# Patient Record
Sex: Male | Born: 1985 | Race: White | Hispanic: No | Marital: Married | Smoking: Current some day smoker
Health system: Southern US, Community
[De-identification: ages and names within clinical notes are randomized; demographics above are authoritative.]

## PROBLEM LIST (undated history)

## (undated) HISTORY — PX: KNEE SURGERY: SHX244

## (undated) HISTORY — PX: OTHER SURGICAL HISTORY: SHX169

---
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
## 2007-01-01 ENCOUNTER — Encounter: Payer: Self-pay | Admitting: Orthopaedic Surgery

## 2007-06-02 ENCOUNTER — Ambulatory Visit: Payer: Self-pay

## 2010-07-07 ENCOUNTER — Ambulatory Visit: Payer: Self-pay | Admitting: Internal Medicine

## 2011-04-05 ENCOUNTER — Ambulatory Visit: Payer: Self-pay

## 2011-04-19 ENCOUNTER — Ambulatory Visit: Payer: Self-pay

## 2011-10-10 ENCOUNTER — Ambulatory Visit: Payer: Self-pay | Admitting: Internal Medicine

## 2011-10-10 LAB — RAPID INFLUENZA A&B ANTIGENS

## 2011-10-10 LAB — RAPID STREP-A WITH REFLX: Micro Text Report: POSITIVE

## 2012-04-01 ENCOUNTER — Ambulatory Visit: Payer: Self-pay | Admitting: Internal Medicine

## 2012-06-21 ENCOUNTER — Ambulatory Visit: Payer: Self-pay | Admitting: Family Medicine

## 2012-06-21 LAB — DOT URINE DIP
Blood: NEGATIVE
Protein: NEGATIVE
Specific Gravity: 1.005 (ref 1.003–1.030)

## 2013-04-16 ENCOUNTER — Encounter: Payer: Self-pay | Admitting: Orthopedic Surgery

## 2013-05-02 ENCOUNTER — Encounter: Payer: Self-pay | Admitting: Orthopedic Surgery

## 2013-08-12 ENCOUNTER — Ambulatory Visit: Payer: Self-pay | Admitting: Internal Medicine

## 2013-08-13 ENCOUNTER — Ambulatory Visit: Payer: Self-pay | Admitting: Physician Assistant

## 2015-08-15 ENCOUNTER — Ambulatory Visit
Admission: EM | Admit: 2015-08-15 | Discharge: 2015-08-15 | Disposition: A | Discharge status: Home / Self Care | Payer: Self-pay | Attending: Family Medicine | Admitting: Family Medicine

## 2015-08-15 ENCOUNTER — Encounter: Payer: Self-pay | Admitting: Emergency Medicine

## 2015-08-15 DIAGNOSIS — Z0289 Encounter for other administrative examinations: Secondary | ICD-10-CM

## 2015-08-15 LAB — DEPT OF TRANSP DIPSTICK, URINE (ARMC ONLY)
Glucose, UA: NEGATIVE mg/dL
HGB URINE DIPSTICK: NEGATIVE
Protein, ur: NEGATIVE mg/dL
SPECIFIC GRAVITY, URINE: 1.015 (ref 1.005–1.030)

## 2015-08-15 NOTE — ED Notes (Signed)
Patient here for DOT Physical.  

## 2015-08-15 NOTE — ED Provider Notes (Signed)
CSN: [id number]     Arrival date & time 08/15/15  [id number] History   First MD Initiated Contact with Patient 08/15/15 0845     Chief Complaint  Patient presents with  . DOT Physical    (Consider location/radiation/quality/duration/timing/severity/associated sxs/prior Treatment) HPI Comments: Patient here for DOT Physical (see scanned form)   The history is provided by the patient.    History reviewed. No pertinent past medical history. Past Surgical History  Procedure Laterality Date  . Knee surgery Right   . Left leg surgery     History reviewed. No pertinent family history. Social History  Substance Use Topics  . Smoking status: Never Smoker   . Smokeless tobacco: Never Used  . Alcohol Use: Yes    Review of Systems  Allergies  Review of patient's allergies indicates no known allergies.  Home Medications   Prior to Admission medications   Not on File   Meds Ordered and Administered this Visit  Medications - No data to display  BP 129/80 mmHg  Pulse 73  Temp(Src) 97 F (36.1 C) (Tympanic)  Resp 16  Ht 5\' 9"  (1.753 m)  Wt 179 lb (81.194 kg)  BMI 26.42 kg/m2  SpO2 100% No data found.   Physical Exam  ED Course  Procedures (including critical care time)  Labs Review Labs Reviewed  DEPT OF TRANSP DIPSTICK, URINE([facility] ONLY)    Imaging Review No results found.   Visual Acuity Review  Right Eye Distance: 20/20 uncorrected Left Eye Distance: 20/13 uncorrected Bilateral Distance: 20/13 uncorrected  Right Eye Near:   Left Eye Near:    Bilateral Near:         MDM   1. Encounter for examination required by Department of Transportation (DOT)    DOT Physical (medically qualified for 2 year; see scanned form)    Payton Mccallumrlando Seriyah Collison, MD 08/15/15 [phone]

## 2016-04-10 ENCOUNTER — Encounter: Payer: Self-pay | Admitting: Emergency Medicine

## 2016-04-10 ENCOUNTER — Ambulatory Visit
Admission: EM | Admit: 2016-04-10 | Discharge: 2016-04-10 | Disposition: A | Discharge status: Home / Self Care | Payer: Self-pay | Attending: Student | Admitting: Student

## 2016-04-10 DIAGNOSIS — L237 Allergic contact dermatitis due to plants, except food: Secondary | ICD-10-CM

## 2016-04-10 MED ORDER — METHYLPREDNISOLONE ACETATE 80 MG/ML IJ SUSP: 120.0000 mg | Freq: Once | INTRAMUSCULAR | Status: DC

## 2016-04-10 MED ORDER — CEPHALEXIN 500 MG PO CAPS: 500.0000 mg | ORAL_CAPSULE | Freq: Four times a day (QID) | ORAL | 0 refills | Status: DC

## 2016-04-10 MED ORDER — METHYLPREDNISOLONE SODIUM SUCC 125 MG IJ SOLR
125.0000 mg | Freq: Once | INTRAMUSCULAR | Status: AC
  Administered 2016-04-10: 125 mg via INTRAMUSCULAR

## 2016-04-10 MED ORDER — HYDROCORTISONE 2.5 % EX LOTN: TOPICAL_LOTION | Freq: Two times a day (BID) | CUTANEOUS | 0 refills | Status: DC

## 2016-04-10 NOTE — ED Notes (Signed)
Patient waited 15 minutes and no reactions were noted. Injection site is unremarkable. 

## 2016-04-10 NOTE — Discharge Instructions (Signed)
-  Apply lotion as instructed. -If redness develops or drainage continued, fill antibiotic.

## 2016-04-10 NOTE — ED Provider Notes (Signed)
CSN: [id number]     Arrival date & time 04/10/16  0913 History   First MD Initiated Contact with Patient 04/10/16 [phone]     Chief Complaint  Patient presents with  . Rash   (Consider location/radiation/quality/duration/timing/severity/associated sxs/prior Treatment)  HPI History reviewed. No pertinent past medical history.   The patient presents today with a itchy rash on several locations of his body starting on Monday. The patient believes that he is working as yard when he came in contact with poison ivy. Since then his notice a rash on the dorsal aspect of the right arm, his chest, back and legs. He noticed that the rash on the right arm is weeping however he has not noticed any redness. Patient presents today for further management.  Past Surgical History:  Procedure Laterality Date  . KNEE SURGERY Right   . left leg surgery      History reviewed. No pertinent family history. Social History  Substance Use Topics  . Smoking status: Never Smoker  . Smokeless tobacco: Never Used  . Alcohol use Yes    Review of Systems  Constitutional: Negative.   HENT: Negative.   Eyes: Negative.   Respiratory: Negative.   Cardiovascular: Negative.   Gastrointestinal: Negative.   Endocrine: Negative.   Musculoskeletal: Negative.   Skin: Positive for rash.  Allergic/Immunologic: Negative.   Neurological: Negative.   Hematological: Negative.   Psychiatric/Behavioral: Negative.     Allergies  Review of patient's allergies indicates no known allergies.  Home Medications   Prior to Admission medications   Medication Sig Start Date End Date Taking? Authorizing Provider  cephALEXin (KEFLEX) 500 MG capsule Take 1 capsule (500 mg total) by mouth 4 (four) times daily. 04/10/16   Anson OregonJames Lance McGhee, PA-C  hydrocortisone 2.5 % lotion Apply topically 2 (two) times daily. 04/10/16   Anson OregonJames Lance McGhee, PA-C   Meds Ordered and Administered this Visit   Medications  methylPREDNISolone sodium  succinate (SOLU-MEDROL) 125 mg/2 mL injection 125 mg (not administered)    BP 128/88 (BP Location: Left Arm)   Pulse 70   Temp 98 F (36.7 C) (Oral)   Resp 16   Ht 5\' 8"  (1.727 m)   Wt 170 lb (77.1 kg)   SpO2 100%   BMI 25.85 kg/m  No data found.  Physical Exam  Constitutional: He appears well-developed and well-nourished. No distress.  Skin: Rash noted. Rash is vesicular. He is not diaphoretic.  Rash resembling poison ivy present to the patient's right arm, chest, back and both quad areas of his legs.  Rash on the right arm is weeping serosanguinous fluid, no purulent discharge or signs of infection.    Urgent Care Course   Clinical Course    Procedures (including critical care time)  Labs Review Labs Reviewed - No data to display  Imaging Review No results found.  MDM   1. Poison ivy dermatitis   -Pt was offered and received a Solu-Medrol injection at today's visit. -He was prescribed hydrocortisone cream to apply to the skin on a daily basis.  He was also prescribed a antibiotic incase he notices any redness or purulent discharge from the arm.  He is to fill this only if he notices these changes. -He will follow-up on a as needed basis.    Anson OregonJames Lance McGhee, PA-C 04/10/16 1006

## 2016-04-10 NOTE — ED Triage Notes (Signed)
Patient itchy rash on his arms that started Monday.

## 2016-06-24 ENCOUNTER — Emergency Department
Admission: EM | Admit: 2016-06-24 | Discharge: 2016-06-24 | Disposition: A | Discharge status: Home / Self Care | Payer: Self-pay | Attending: Emergency Medicine | Admitting: Emergency Medicine

## 2016-06-24 ENCOUNTER — Encounter: Payer: Self-pay | Admitting: *Deleted

## 2016-06-24 ENCOUNTER — Emergency Department: Payer: Self-pay

## 2016-06-24 DIAGNOSIS — Z23 Encounter for immunization: Secondary | ICD-10-CM | POA: Insufficient documentation

## 2016-06-24 DIAGNOSIS — Y939 Activity, unspecified: Secondary | ICD-10-CM | POA: Insufficient documentation

## 2016-06-24 DIAGNOSIS — S61011A Laceration without foreign body of right thumb without damage to nail, initial encounter: Secondary | ICD-10-CM | POA: Insufficient documentation

## 2016-06-24 DIAGNOSIS — S61411A Laceration without foreign body of right hand, initial encounter: Secondary | ICD-10-CM

## 2016-06-24 DIAGNOSIS — Y929 Unspecified place or not applicable: Secondary | ICD-10-CM | POA: Insufficient documentation

## 2016-06-24 DIAGNOSIS — Y999 Unspecified external cause status: Secondary | ICD-10-CM | POA: Insufficient documentation

## 2016-06-24 DIAGNOSIS — S60221A Contusion of right hand, initial encounter: Secondary | ICD-10-CM

## 2016-06-24 DIAGNOSIS — W228XXA Striking against or struck by other objects, initial encounter: Secondary | ICD-10-CM | POA: Insufficient documentation

## 2016-06-24 MED ORDER — LIDOCAINE HCL (PF) 1 % IJ SOLN
5.0000 mL | Freq: Once | INTRAMUSCULAR | Status: AC
  Administered 2016-06-24: 5 mL
  Filled 2016-06-24: qty 10

## 2016-06-24 MED ORDER — TETANUS-DIPHTH-ACELL PERTUSSIS 5-2.5-18.5 LF-MCG/0.5 IM SUSP
0.5000 mL | Freq: Once | INTRAMUSCULAR | Status: AC
  Administered 2016-06-24: 0.5 mL via INTRAMUSCULAR
  Filled 2016-06-24: qty 0.5

## 2016-06-24 MED ORDER — CLINDAMYCIN HCL 300 MG PO CAPS: 300.0000 mg | ORAL_CAPSULE | Freq: Three times a day (TID) | ORAL | 0 refills | Status: DC

## 2016-06-24 MED ORDER — CEPHALEXIN 500 MG PO CAPS: 500.0000 mg | ORAL_CAPSULE | Freq: Three times a day (TID) | ORAL | 0 refills | Status: DC

## 2016-06-24 MED ORDER — CEPHALEXIN 500 MG PO CAPS
500.0000 mg | ORAL_CAPSULE | Freq: Once | ORAL | Status: AC
  Administered 2016-06-24: 500 mg via ORAL
  Filled 2016-06-24: qty 1

## 2016-06-24 MED ORDER — LIDOCAINE-EPINEPHRINE (PF) 1 %-1:200000 IJ SOLN: 10.0000 mL | Freq: Once | INTRAMUSCULAR | Status: DC

## 2016-06-24 MED ORDER — CLINDAMYCIN HCL 150 MG PO CAPS
300.0000 mg | ORAL_CAPSULE | Freq: Once | ORAL | Status: AC
  Administered 2016-06-24: 300 mg via ORAL
  Filled 2016-06-24: qty 2

## 2016-06-24 MED ORDER — IBUPROFEN 800 MG PO TABS
800.0000 mg | ORAL_TABLET | Freq: Once | ORAL | Status: AC
  Administered 2016-06-24: 800 mg via ORAL
  Filled 2016-06-24: qty 1

## 2016-06-24 MED ORDER — IBUPROFEN 800 MG PO TABS: 800.0000 mg | ORAL_TABLET | Freq: Three times a day (TID) | ORAL | 0 refills | Status: DC | PRN

## 2016-06-24 NOTE — ED Triage Notes (Signed)
Patient states he punched a window with his right hand and has laceration on right thumb.

## 2016-06-24 NOTE — ED Provider Notes (Signed)
[facility] Emergency Department Provider Note  ____________________________________________  Time seen: Approximately 3:13 PM  I have reviewed the triage vital signs and the nursing notes.   HISTORY  Chief Complaint Laceration    HPI Ronald Vega is a 30 y.o. male , NAD, presents to the emergency department for evaluation of right hand pain and lacerations. Patient states he dropped the bowl of green beans than became angry and punched through glass with his right hand.Has pain about the right thumb and palmar surface of the hand at the base of the thumb. Has full range of motion of all digits of the right hand as well as the wrist and hand since the incident. It is uncertain if any foreign bodies are present in the hand due to the glass breaking. Denies numbness, weakness, tingling. Has had no redness, swelling or bruising. Denies fevers or chills. Is uncertain when his last tetanus vaccination was.   History reviewed. No pertinent past medical history.  There are no active problems to display for this patient.   Past Surgical History:  Procedure Laterality Date  . KNEE SURGERY Right   . left leg surgery      Prior to Admission medications   Medication Sig Start Date End Date Taking? Authorizing Provider  cephALEXin (KEFLEX) 500 MG capsule Take 1 capsule (500 mg total) by mouth 3 (three) times daily. 06/24/16   Endrit Gittins L Varnell Donate, PA-C  clindamycin (CLEOCIN) 300 MG capsule Take 1 capsule (300 mg total) by mouth 3 (three) times daily. 06/24/16   Pamla Pangle L Mikela Senn, PA-C  hydrocortisone 2.5 % lotion Apply topically 2 (two) times daily. 04/10/16   Anson OregonJames Lance McGhee, PA-C  ibuprofen (ADVIL,MOTRIN) 800 MG tablet Take 1 tablet (800 mg total) by mouth every 8 (eight) hours as needed (pain). 06/24/16   Dwana Garin L Kierria Feigenbaum, PA-C    Allergies Patient has no known allergies.  History reviewed. No pertinent family history.  Social History Social History  Substance Use  Topics  . Smoking status: Never Smoker  . Smokeless tobacco: Never Used  . Alcohol use Yes     Comment: occasionally     Review of Systems  Constitutional: No fever/chills Musculoskeletal: Positive right hand pain at site of lacerations. Negative for wrist pain.  Skin: Positive lacerations right thumb. Negative for rash, redness, swelling, bruising. Neurological: Negative for Numbness, weakness, tingling  ____________________________________________   PHYSICAL EXAM:  VITAL SIGNS: ED Triage Vitals  Enc Vitals Group     BP 06/24/16 1509 140/71     Pulse Rate 06/24/16 1509 72     Resp 06/24/16 1509 18     Temp --      Temp src --      SpO2 06/24/16 1509 97 %     Weight 06/24/16 1502 175 lb (79.4 kg)     Height 06/24/16 1502 5\' 9"  (1.753 m)     Head Circumference --      Peak Flow --      Pain Score 06/24/16 1502 1     Pain Loc --      Pain Edu? --      Excl. in GC? --      Constitutional: Alert and oriented. Well appearing and in no acute distress. Eyes: Conjunctivae are normal. Head: Atraumatic. Cardiovascular: Good peripheral circulation with 2+ pulses noted in the right upper extremity. Capillary refill is brisk in all digits of the right hand. Respiratory: Normal respiratory effort without tachypnea or retractions.  Musculoskeletal:  Full range of motion of the right wrist, hand and fingers without difficulty. Strength of all fingers on the right hand is 5/5. No lower extremity tenderness nor edema.  No joint effusions. Neurologic:  Normal speech and language. No gross focal neurologic deficits are appreciated.  Skin:  1.5cm laceration to right dorsal thumb. Multiple small, superficial lacerations noted around the right thumb and hand with bleeding controlled. All wounds were thoroughly irrigated and no foreign bodies palpated nor visualized. Skin is warm, dry and intact. No rash noted. Psychiatric: Mood and affect are normal. Speech and behavior are normal. Patient  exhibits appropriate insight and judgement.   ____________________________________________   LABS  None ____________________________________________  EKG  None ____________________________________________  RADIOLOGY I, Hope PigeonJami L Melbourne Jakubiak, personally viewed and evaluated these images (plain radiographs) as part of my medical decision making, as well as reviewing the written report by the radiologist.  Dg Hand Complete Right  Result Date: 06/24/2016 CLINICAL DATA:  Hand laceration from window.  Initial encounter. EXAM: RIGHT HAND - COMPLETE 3+ VIEW COMPARISON:  None. FINDINGS: Faint densities overlapping the thumb on the AP views are not seen laterally and not convincing for embedded foreign body. No acute fracture or malalignment. IMPRESSION: Negative for fracture or definite foreign body. Electronically Signed   By: Marnee SpringJonathon  Watts M.D.   On: 06/24/2016 15:31    ____________________________________________    PROCEDURES  Procedure(s) performed: None   .Marland Kitchen.Laceration Repair Date/Time: 06/24/2016 4:28 PM Performed by: Hope PigeonHAGLER, Alzena Gerber L Authorized by: Hope PigeonHAGLER, Cayde Held L   Consent:    Consent obtained:  Verbal   Consent given by:  Patient   Risks discussed:  Infection, pain and poor cosmetic result   Alternatives discussed:  No treatment Anesthesia (see MAR for exact dosages):    Anesthesia method:  Local infiltration   Local anesthetic:  Lidocaine 1% w/o epi Laceration details:    Location:  Finger   Finger location:  R thumb   Length (cm):  1.5   Depth (mm):  4 Repair type:    Repair type:  Simple Pre-procedure details:    Preparation:  Patient was prepped and draped in usual sterile fashion and imaging obtained to evaluate for foreign bodies Exploration:    Hemostasis achieved with:  Direct pressure and tourniquet   Wound exploration: wound explored through full range of motion and entire depth of wound probed and visualized     Wound extent: areolar tissue violated and  fascia violated     Wound extent: no foreign bodies/material noted, no muscle damage noted, no nerve damage noted, no tendon damage noted, no underlying fracture noted and no vascular damage noted     Contaminated: no   Treatment:    Area cleansed with:  Betadine   Amount of cleaning:  Standard   Irrigation solution:  Sterile saline   Irrigation method:  Pressure wash   Foreign body removal: No foreign bodies.   Skin repair:    Repair method:  Sutures   Suture size:  4-0   Suture material:  Nylon   Suture technique:  Simple interrupted   Number of sutures:  4 Approximation:    Approximation:  Close   Vermilion border: well-aligned   Post-procedure details:    Dressing:  Non-adherent dressing and splint for protection   Patient tolerance of procedure:  Tolerated well, no immediate complications     Medications  Tdap (BOOSTRIX) injection 0.5 mL (0.5 mLs Intramuscular Given 06/24/16 1517)  lidocaine (PF) (XYLOCAINE) 1 % injection 5  mL (5 mLs Infiltration Given 06/24/16 1632)  cephALEXin (KEFLEX) capsule 500 mg (500 mg Oral Given 06/24/16 1632)  clindamycin (CLEOCIN) capsule 300 mg (300 mg Oral Given 06/24/16 1632)  ibuprofen (ADVIL,MOTRIN) tablet 800 mg (800 mg Oral Given 06/24/16 1632)     ____________________________________________   INITIAL IMPRESSION / ASSESSMENT AND PLAN / ED COURSE  Pertinent labs & imaging results that were available during my care of the patient were reviewed by me and considered in my medical decision making (see chart for details).  Clinical Course as of Jun 24 1640  Thu Jun 24, 2016  1542 No lidocaine with epi is available due to national shortage.   [JH]    Clinical Course User Index [JH] Carly Sabo L Gilmer Kaminsky, PA-C    Patient's diagnosis is consistent with Laceration right thumb without foreign body without damage to nail, laceration right around without foreign body, contusion of right hand and need for tetanus booster. Patient's right thumb was  placed in a splint for comfort care. Patient tolerated tetanus booster in the emergency without immediate adverse event. Patient was given first doses of antibiotics as well as ibuprofen prior to discharge and tolerated well without side effects. Patient will be discharged home with prescriptions for Keflex and clindamycin to take as directed. Patient is to follow up with Dr. Hyacinth Meeker in orthopedics if any symptoms of infection are noted. Patient is to follow-up with [facility] in one week for suture removal. Patient is given ED precautions to return to the ED for any worsening or new symptoms.    ____________________________________________  FINAL CLINICAL IMPRESSION(S) / ED DIAGNOSES  Final diagnoses:  Laceration of right thumb without foreign body without damage to nail, initial encounter  Laceration of right hand without foreign body, initial encounter  Need for tetanus booster  Contusion of right hand, initial encounter      NEW MEDICATIONS STARTED DURING THIS VISIT:  New Prescriptions   CEPHALEXIN (KEFLEX) 500 MG CAPSULE    Take 1 capsule (500 mg total) by mouth 3 (three) times daily.   CLINDAMYCIN (CLEOCIN) 300 MG CAPSULE    Take 1 capsule (300 mg total) by mouth 3 (three) times daily.   IBUPROFEN (ADVIL,MOTRIN) 800 MG TABLET    Take 1 tablet (800 mg total) by mouth every 8 (eight) hours as needed (pain).         Hope Pigeon, PA-C 06/24/16 1643    Jeanmarie Plant, MD 06/24/16 [phone]

## 2017-03-30 ENCOUNTER — Ambulatory Visit
Admission: EM | Admit: 2017-03-30 | Discharge: 2017-03-30 | Disposition: A | Discharge status: Home / Self Care | Payer: Self-pay | Attending: Family Medicine | Admitting: Family Medicine

## 2017-03-30 DIAGNOSIS — L237 Allergic contact dermatitis due to plants, except food: Secondary | ICD-10-CM

## 2017-03-30 MED ORDER — METHYLPREDNISOLONE SODIUM SUCC 125 MG IJ SOLR
125.0000 mg | Freq: Once | INTRAMUSCULAR | Status: AC
  Administered 2017-03-30: 125 mg via INTRAMUSCULAR

## 2017-03-30 MED ORDER — PREDNISONE 10 MG (21) PO TBPK: ORAL_TABLET | ORAL | 0 refills | Status: DC

## 2017-03-30 NOTE — ED Provider Notes (Signed)
[facility]    CSN: [id number] Arrival date & time: 03/30/17  0800     History   Chief Complaint Chief Complaint  Patient presents with  . Poison Ivy    HPI Ronald Vega is a 31 y.o. male.   Patient reports being out in the yard on Saturday and Sunday he started itching Monday rash really start working out and then Wednesday is over most of his body face arms and legs. He states is very sensitive to poison ivy and poison oak. No known drug allergies he does not smoke. Surgeries as a couple orthopedic surgeries no chronic medical problems and no pertinent family medical history relevant to today's visit.   The history is provided by the patient. No language interpreter was used.  Poison Lajoyce Corners  This is a new problem. The current episode started more than 2 days ago. The problem has been rapidly worsening. Pertinent negatives include no chest pain, no abdominal pain, no headaches and no shortness of breath. Nothing aggravates the symptoms. Nothing relieves the symptoms. The treatment provided no relief.    History reviewed. No pertinent past medical history.  There are no active problems to display for this patient.   Past Surgical History:  Procedure Laterality Date  . KNEE SURGERY Right   . left leg surgery         Home Medications    Prior to Admission medications   Medication Sig Start Date End Date Taking? Authorizing Provider  cephALEXin (KEFLEX) 500 MG capsule Take 1 capsule (500 mg total) by mouth 3 (three) times daily. 06/24/16   Hagler, Jami L, PA-C  clindamycin (CLEOCIN) 300 MG capsule Take 1 capsule (300 mg total) by mouth 3 (three) times daily. 06/24/16   Hagler, Jami L, PA-C  hydrocortisone 2.5 % lotion Apply topically 2 (two) times daily. 04/10/16   Anson Oregon, PA-C  ibuprofen (ADVIL,MOTRIN) 800 MG tablet Take 1 tablet (800 mg total) by mouth every 8 (eight) hours as needed (pain). 06/24/16   Hagler, Jami L, PA-C  predniSONE  (STERAPRED UNI-PAK 21 TAB) 10 MG (21) TBPK tablet 6 tabs day 1 and 2, 5 tabs day 3 and 4, 4 tabs day 5 and 6, 3 tabs day 7 and 8, 2 tabs day 9 and 10, 1 tab day 11 and 12. Take orally 03/30/17   Hassan Rowan, MD    Family History History reviewed. No pertinent family history.  Social History Social History  Substance Use Topics  . Smoking status: Never Smoker  . Smokeless tobacco: Never Used  . Alcohol use Yes     Comment: occasionally     Allergies   Patient has no known allergies.   Review of Systems Review of Systems  Respiratory: Negative for shortness of breath.   Cardiovascular: Negative for chest pain.  Gastrointestinal: Negative for abdominal pain.  Skin: Positive for rash.  Neurological: Negative for headaches.  All other systems reviewed and are negative.    Physical Exam Triage Vital Signs ED Triage Vitals  Enc Vitals Group     BP 03/30/17 0817 137/78     Pulse Rate 03/30/17 0817 69     Resp 03/30/17 0817 16     Temp 03/30/17 0817 97.9 F (36.6 C)     Temp Source 03/30/17 0817 Oral     SpO2 03/30/17 0817 100 %     Weight 03/30/17 0818 175 lb (79.4 kg)     Height 03/30/17 0818 5\' 9"  (1.753 m)  Head Circumference --      Peak Flow --      Pain Score --      Pain Loc --      Pain Edu? --      Excl. in GC? --    No data found.   Updated Vital Signs BP 137/78 (BP Location: Right Arm)   Pulse 69   Temp 97.9 F (36.6 C) (Oral)   Resp 16   Ht 5\' 9"  (1.753 m)   Wt 175 lb (79.4 kg)   SpO2 100%   BMI 25.84 kg/m   Visual Acuity Right Eye Distance:   Left Eye Distance:   Bilateral Distance:    Right Eye Near:   Left Eye Near:    Bilateral Near:     Physical Exam  Constitutional: He is oriented to person, place, and time. He appears well-developed and well-nourished. No distress.  HENT:  Head: Normocephalic and atraumatic.  Right Ear: External ear normal.  Left Ear: External ear normal.  Mouth/Throat: Oropharynx is clear and moist.    Eyes: Pupils are equal, round, and reactive to light.  Neck: Normal range of motion. Neck supple.  Pulmonary/Chest: Effort normal.  Musculoskeletal: Normal range of motion. He exhibits no edema.  Neurological: He is alert and oriented to person, place, and time.  Skin: Rash noted. He is not diaphoretic. There is erythema.  Psychiatric: He has a normal mood and affect.  Vitals reviewed.    UC Treatments / Results  Labs (all labs ordered are listed, but only abnormal results are displayed) Labs Reviewed - No data to display  EKG  EKG Interpretation None       Radiology No results found.  Procedures Procedures (including critical care time)  Medications Ordered in UC Medications  methylPREDNISolone sodium succinate (SOLU-MEDROL) 125 mg/2 mL injection 125 mg (not administered)     Initial Impression / Assessment and Plan / UC Course  I have reviewed the triage vital signs and the nursing notes.  Pertinent labs & imaging results that were available during my care of the patient were reviewed by me and considered in my medical decision making (see chart for details).   patient was given an injection of Solu-Medrol 125 IM follow-up PCP in 2 weeks needed declined work note will also add 12 days of prednisone as well to his treatment plan  Final Clinical Impressions(s) / UC Diagnoses   Final diagnoses:  Allergic contact dermatitis due to plants, except food    New Prescriptions New Prescriptions   PREDNISONE (STERAPRED UNI-PAK 21 TAB) 10 MG (21) TBPK TABLET    6 tabs day 1 and 2, 5 tabs day 3 and 4, 4 tabs day 5 and 6, 3 tabs day 7 and 8, 2 tabs day 9 and 10, 1 tab day 11 and 12. Take orally   Note: This dictation was prepared with Dragon dictation along with smaller phrase technology. Any transcriptional errors that result from this process are unintentional.  Controlled Substance Prescriptions [place] Controlled Substance Registry consulted? Not Applicable   Hassan Rowan, MD 03/30/17 [phone]

## 2017-03-30 NOTE — ED Triage Notes (Signed)
Reports poison ivy "all over" and "I get it every year".

## 2018-08-11 IMAGING — DX DG HAND COMPLETE 3+V*R*
3 series · 3 of 3 positions shown · non-contrast
Comparison: None.

CLINICAL DATA: Hand laceration from window.  Initial encounter.

EXAM:
RIGHT HAND - COMPLETE 3+ VIEW

[hand ap]
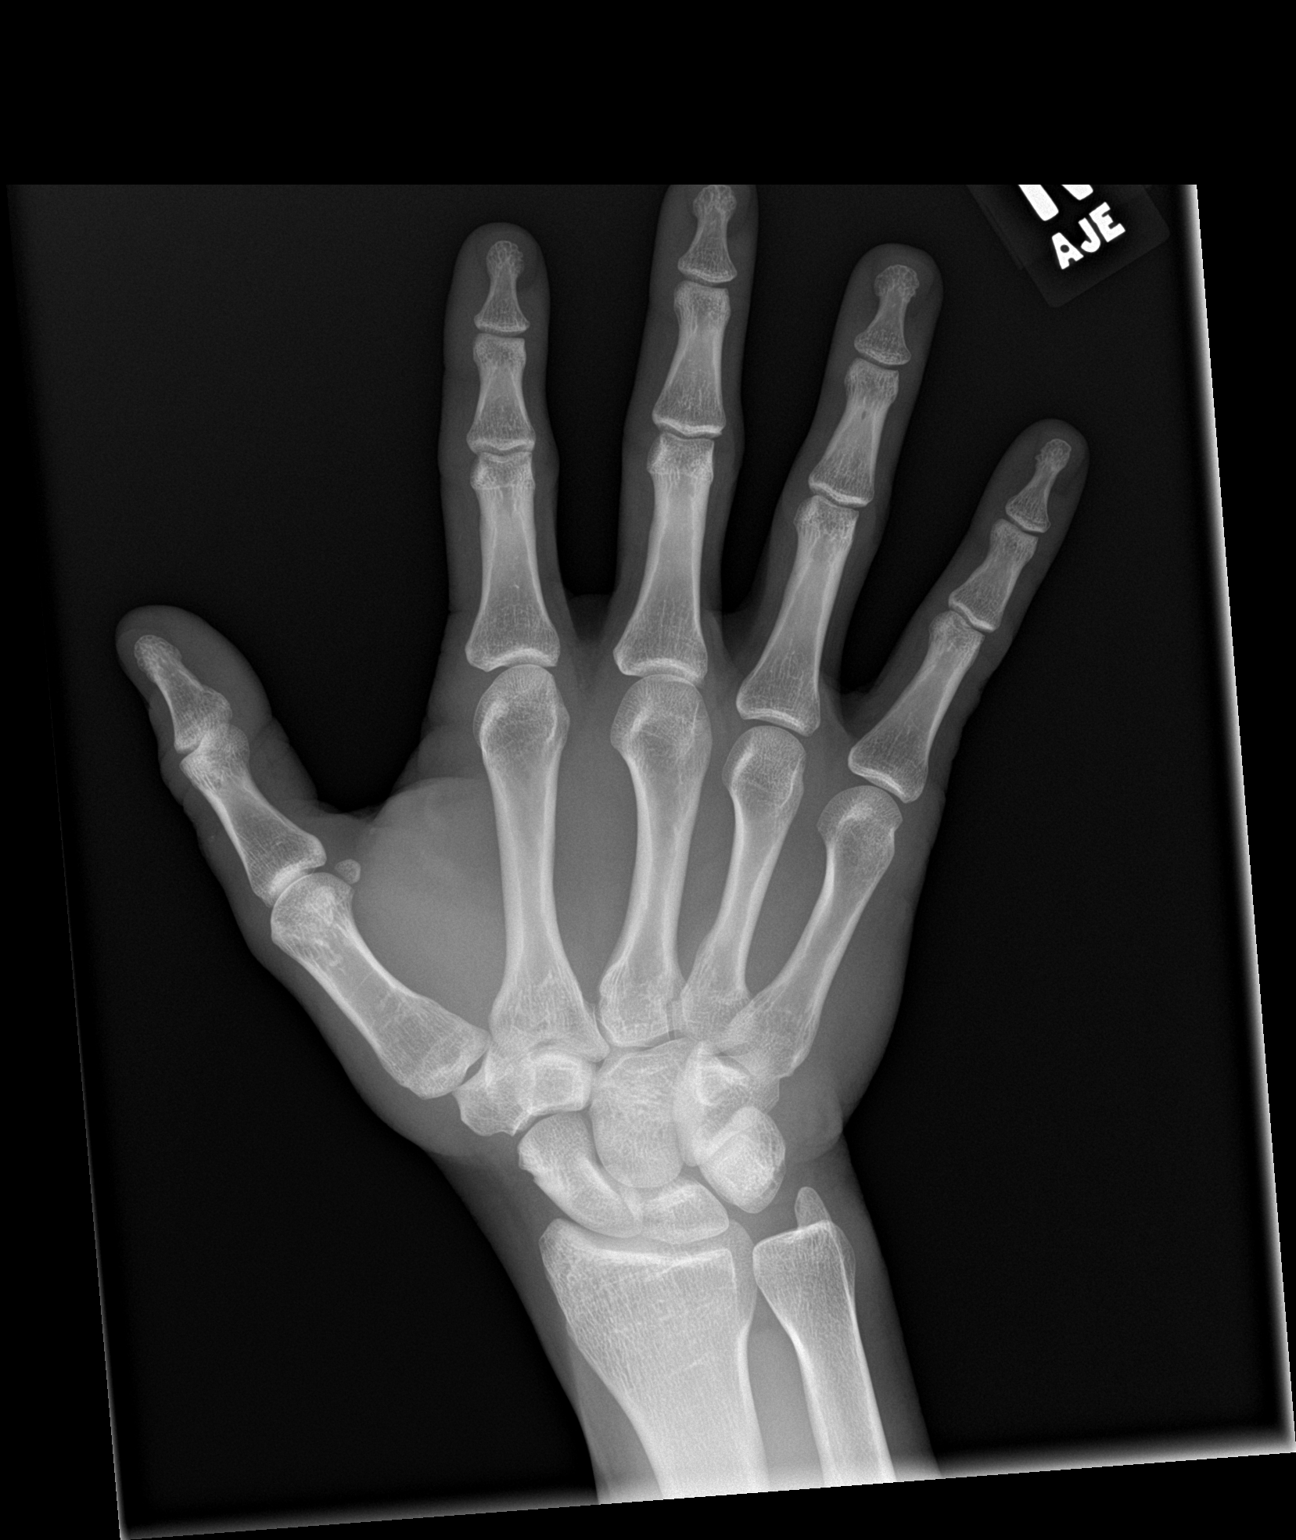

[hand obl]
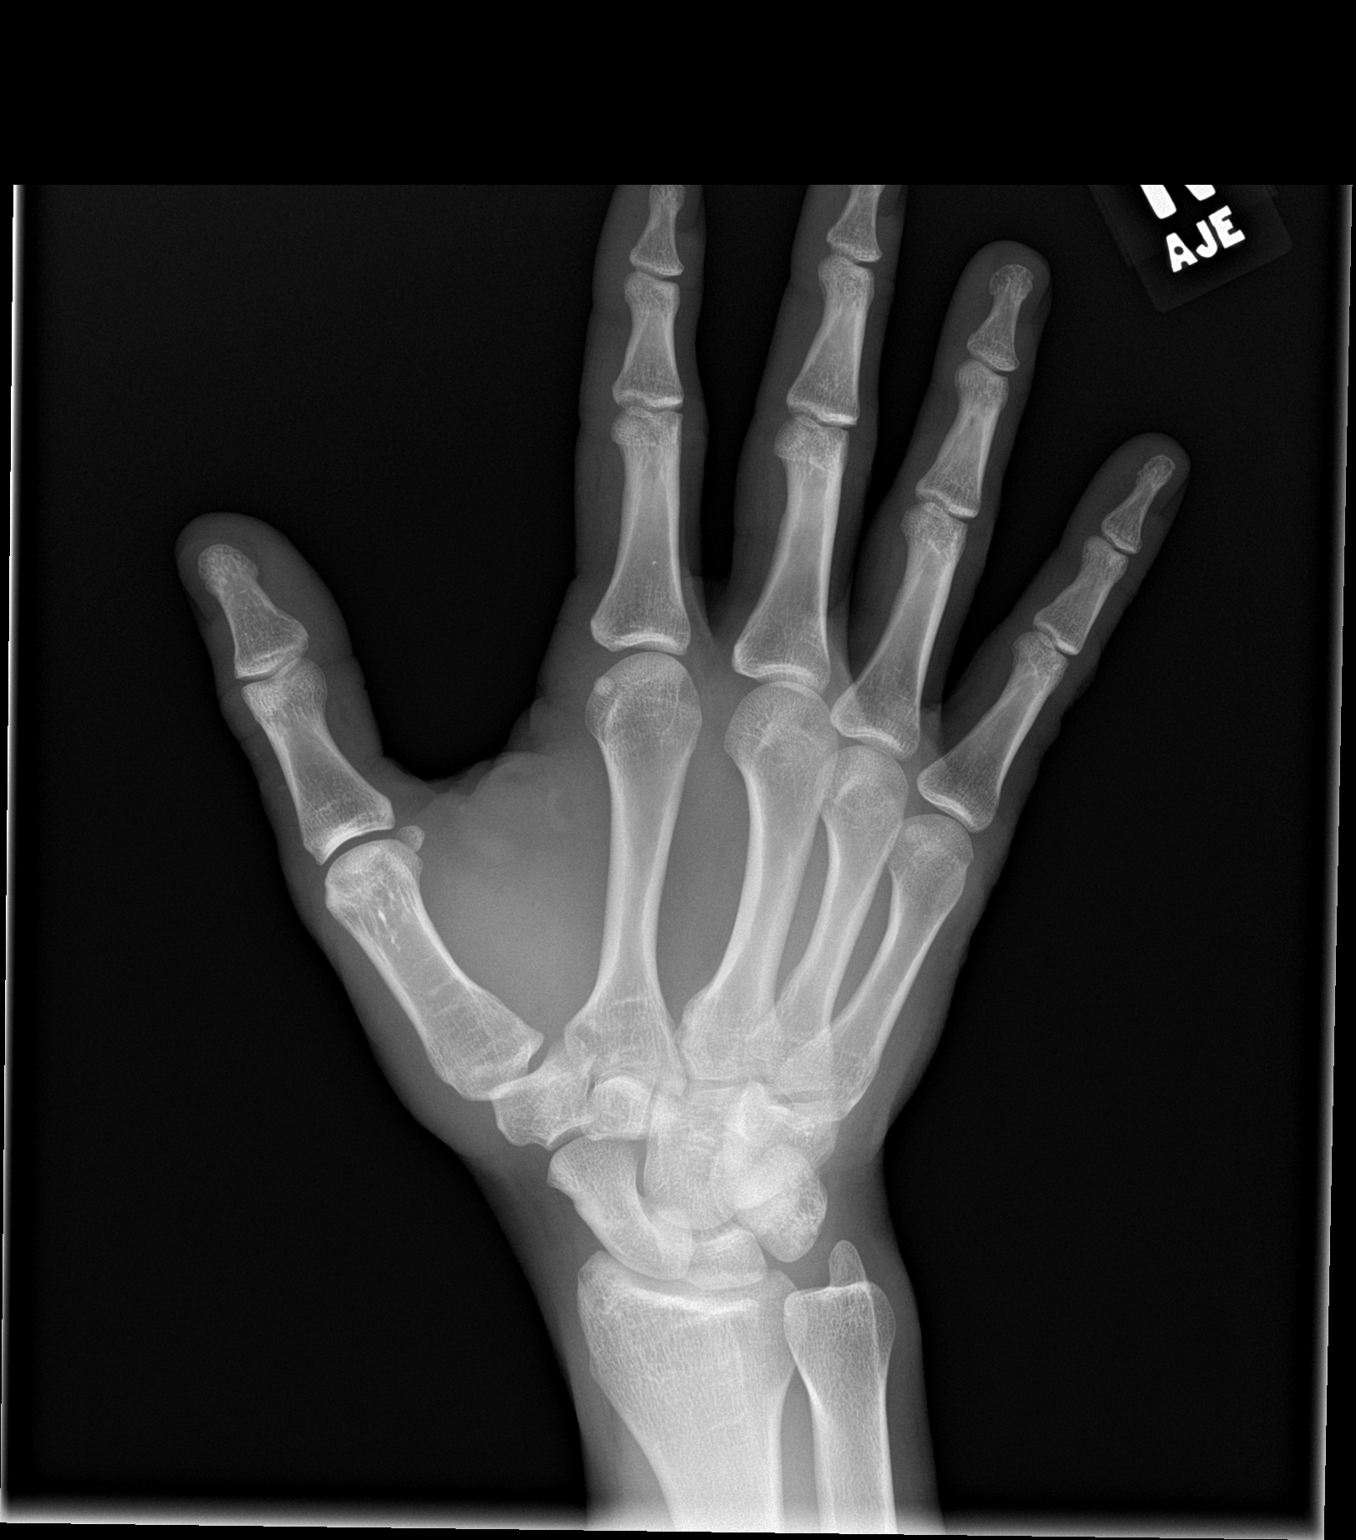

[hand lat]
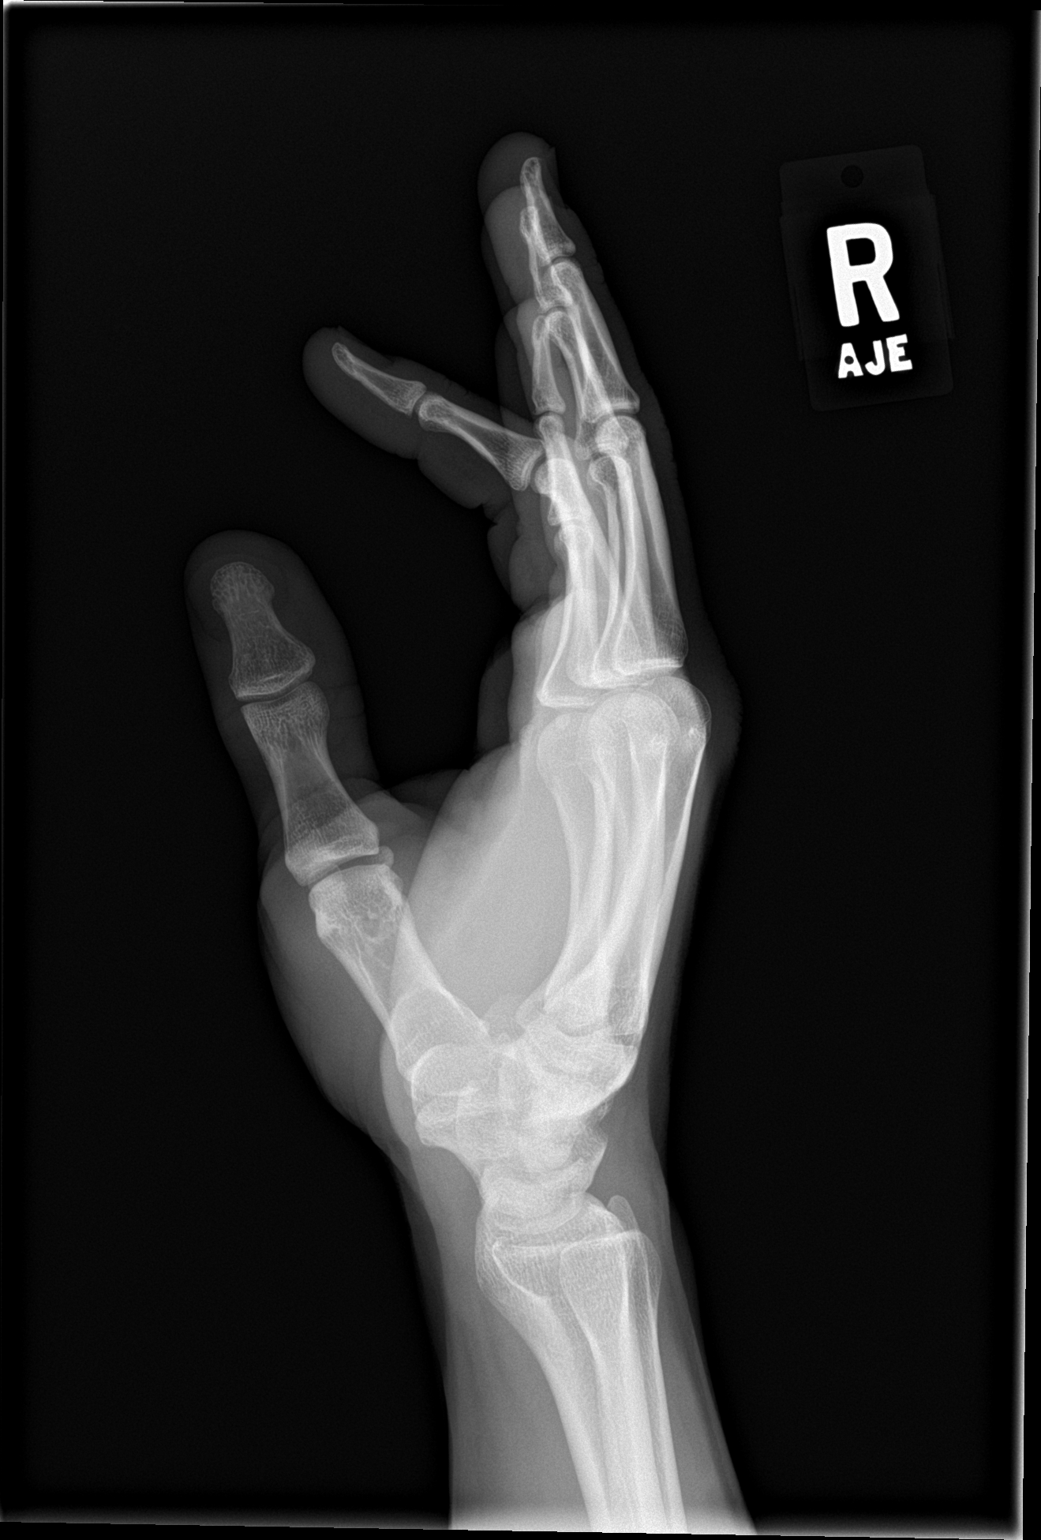

[3 of 3 positions shown; findings below may reference images not displayed]

FINDINGS: Faint densities overlapping the thumb on the AP views are not seen
laterally and not convincing for embedded foreign body. No acute
fracture or malalignment.
IMPRESSION: Negative for fracture or definite foreign body.

## 2019-02-18 ENCOUNTER — Other Ambulatory Visit: Payer: Self-pay

## 2019-02-18 ENCOUNTER — Ambulatory Visit
Admission: EM | Admit: 2019-02-18 | Discharge: 2019-02-18 | Disposition: A | Discharge status: Home / Self Care | Payer: Self-pay | Attending: Urgent Care | Admitting: Urgent Care

## 2019-02-18 DIAGNOSIS — J0100 Acute maxillary sinusitis, unspecified: Secondary | ICD-10-CM

## 2019-02-18 MED ORDER — AMOXICILLIN-POT CLAVULANATE 875-125 MG PO TABS: 1.0000 | ORAL_TABLET | Freq: Two times a day (BID) | ORAL | 0 refills | Status: AC

## 2019-02-18 NOTE — ED Provider Notes (Signed)
[place], [place]   Name: Ronald Vega DOB: 1985/08/12 MRN: [id number] CSN: [id number] PCP: Patient, No Pcp Per  Arrival date and time:  02/18/19 0804  Chief Complaint:  Facial Pain and Dental Pain   NOTE: Prior to seeing the patient today, I have reviewed the triage nursing documentation and vital signs. Clinical staff has updated patient's PMH/PSHx, current medication list, and drug allergies/intolerances to ensure comprehensive history available to assist in medical decision making.   History:   HPI: Ronald Vega is a 33 y.o. male who presents today with complaints of upper respiratory symptoms that began 2 days ago (02/16/2019). He complains of LEFT paranasal sinus tenderness, congestion, and rhinorrhea (green/yellow). Patient notes that the pain is to the point of causing his upper teeth to hurt. Patient states, "it hurts to chew, but the funny thing is that it is only on the LEFT side". He denies any associated fevers. Patient does not have a history of recurrent sinus issues.    History reviewed. No pertinent past medical history.  Past Surgical History:  Procedure Laterality Date  . KNEE SURGERY Right   . left leg surgery      History reviewed. No pertinent family history.  Social History   Tobacco Use  . Smoking status: Never Smoker  . Smokeless tobacco: Never Used  Substance Use Topics  . Alcohol use: Yes    Comment: rare  . Drug use: No    There are no active problems to display for this patient.   Home Medications:    No outpatient medications have been marked as taking for the 02/18/19 encounter [facility] Encounter).    Allergies:   Patient has no known allergies.  Review of Systems (ROS): Review of Systems  Constitutional: Negative for chills and fever.  HENT: Positive for congestion, dental problem, rhinorrhea, sinus pressure and sinus pain. Negative for ear pain, facial swelling, nosebleeds, postnasal drip, sneezing, sore throat and  tinnitus.   Respiratory: Negative for cough and shortness of breath.   Cardiovascular: Negative for chest pain and palpitations.  Gastrointestinal: Negative for diarrhea, nausea and vomiting.  Musculoskeletal: Negative for arthralgias, back pain, myalgias and neck pain.  Skin: Negative for color change, pallor and rash.  Neurological: Negative for dizziness, syncope, weakness, numbness and headaches.  Hematological: Negative for adenopathy.  All other systems reviewed and are negative.    Vital Signs: Today's Vitals   02/18/19 0816 02/18/19 0817 02/18/19 0834  BP: (!) 146/88    Pulse: 87    Resp: 16    Temp: 98.6 F (37 C)    TempSrc: Oral    SpO2: 98%    Weight:  175 lb (79.4 kg)   Height:  5\' 8"  (1.727 m)   PainSc:  0-No pain 0-No pain    Physical Exam: Physical Exam  Constitutional: He is oriented to person, place, and time and well-developed, well-nourished, and in no distress. Vital signs are normal.  Non-toxic appearance. He has a sickly appearance.  HENT:  Head: Normocephalic and atraumatic.  Right Ear: Hearing, tympanic membrane, external ear and ear canal normal.  Left Ear: Hearing, external ear and ear canal normal. No swelling or tenderness. Tympanic membrane is injected.  No middle ear effusion.  Nose: Mucosal edema and rhinorrhea present. Left sinus exhibits maxillary sinus tenderness.  Mouth/Throat: Uvula is midline and mucous membranes are normal. No oropharyngeal exudate, posterior oropharyngeal edema or posterior oropharyngeal erythema.  Cardiovascular: Normal rate, regular rhythm, normal heart sounds and intact distal  pulses. Exam reveals no gallop and no friction rub.  No murmur heard. Pulmonary/Chest: Effort normal and breath sounds normal. No respiratory distress. He has no wheezes. He has no rales.  Lymphadenopathy:       Head (left side): Submandibular adenopathy present.  Neurological: He is alert and oriented to person, place, and time. Gait normal.  GCS score is 15.  Skin: Skin is warm and dry. No rash noted.  Psychiatric: Mood, memory, affect and judgment normal.  Nursing note and vitals reviewed.   Urgent Care Treatments / Results:   LABS: PLEASE NOTE: all labs that were ordered this encounter are listed, however only abnormal results are displayed. Labs Reviewed - No data to display  EKG: -None  RADIOLOGY: No results found.  PROCEDURES: Procedures  MEDICATIONS RECEIVED THIS VISIT: Medications - No data to display  PERTINENT CLINICAL COURSE NOTES/UPDATES:   Initial Impression / Assessment and Plan / Urgent Care Course:  Pertinent labs & imaging results that were available during my care of the patient were personally reviewed by me and considered in my medical decision making (see lab/imaging section of note for values and interpretations).  Cydney OkDaniel H Baiza is a 33 y.o. male who [facility] with complaints of Facial Pain and Dental Pain   Patient is well appearing overall in clinic today. He does not appear to be in any acute distress. Presenting symptoms (see HPI) and exam as documented above. Symptom constellation and exam consistent with acute maxillary sinusitis. Will treat with a 10 day course of Augmentin. Patient educated on supportive care through rest, increased hydration, and use of PRN APAP/IBU for discomfort.   Discussed follow up with primary care physician in 1 week for re-evaluation. I have reviewed the follow up and strict return precautions for any new or worsening symptoms. Patient is aware of symptoms that would be deemed urgent/emergent, and would thus require further evaluation either here or in the emergency department. At the time of discharge, he verbalized understanding and consent with the discharge plan as it was reviewed with him. All questions were fielded by provider and/or clinic staff prior to patient discharge.    Final Clinical Impressions / Urgent Care  Diagnoses:   Final diagnoses:  Acute non-recurrent maxillary sinusitis    New Prescriptions:  [place] Controlled Substance Registry consulted? Not Applicable  Meds ordered this encounter  Medications  . amoxicillin-clavulanate (AUGMENTIN) 875-125 MG tablet    Sig: Take 1 tablet by mouth 2 (two) times daily for 10 days.    Dispense:  20 tablet    Refill:  0    Recommended Follow up Care:  Patient encouraged to follow up with the following provider within the specified time frame, or sooner as dictated by the severity of his symptoms. As always, he was instructed that for any urgent/emergent care needs, he should seek care either here or in the emergency department for more immediate evaluation.  Follow-up Information    PCP In 1 week.   Why: General reassessment of symptoms if not improving        NOTE: This note was prepared using Scientist, clinical (histocompatibility and immunogenetics)Dragon dictation software along with smaller Lobbyistphrase technology. Despite my best ability to proofread, there is the potential that transcriptional errors may still occur from this process, and are completely unintentional.     Verlee MonteGray, Meighan Treto E, NP 02/18/19 [phone]

## 2019-02-18 NOTE — ED Triage Notes (Signed)
Pt states he has a sinus infection. Green/yellow nasal drainage, eye pain, teeth pain, face pain

## 2019-02-18 NOTE — Discharge Instructions (Addendum)
It was very nice seeing you today in clinic. Thank you for entrusting me with your care.   Please utilize the medications that we discussed. Your prescriptions have been called in to your pharmacy. Stay HYDRATED. Water and electrolyte containing beverages (Gatorade, Pedialyte) are best to prevent dehydration and electrolyte abnormalities. May use Tylenol and/or Ibuprofen as needed for pain/fever.   Make arrangements to follow up with your regular doctor in 1 week for re-evaluation if not improving. If your symptoms/condition worsens, please seek follow up care either here or in the ER. Please remember, our [place] providers are "right here with you" when you need [place].   Again, it was my pleasure to take care of you today. Thank you for choosing our clinic. I hope that you start to feel better quickly.   Honor Loh, MSN, APRN, FNP-C, CEN Advanced Practice Provider [facility]

## 2019-04-23 ENCOUNTER — Telehealth: Payer: Self-pay | Admitting: Urology

## 2019-04-23 DIAGNOSIS — K4090 Unilateral inguinal hernia, without obstruction or gangrene, not specified as recurrent: Secondary | ICD-10-CM

## 2019-04-23 NOTE — Telephone Encounter (Signed)
33 y.o. male who desires vasectomy as a means permanent sterilization.  He also has a symptomatic inguinal hernia which he also desires repair.  He was informed he would need a general surgery referral for the inguinal hernia repair.  I be happy to perform vasectomy at that time.

## 2019-05-02 ENCOUNTER — Ambulatory Visit: Payer: Self-pay | Admitting: Surgery

## 2019-05-02 ENCOUNTER — Other Ambulatory Visit: Payer: Self-pay

## 2019-05-02 ENCOUNTER — Encounter: Payer: Self-pay | Admitting: Surgery

## 2019-05-02 VITALS — BP 132/88 | HR 86 | Temp 97.9°F | Resp 14 | Ht 69.0 in | Wt 180.6 lb

## 2019-05-02 DIAGNOSIS — K4090 Unilateral inguinal hernia, without obstruction or gangrene, not specified as recurrent: Secondary | ICD-10-CM

## 2019-05-02 NOTE — Patient Instructions (Addendum)
Our surgery scheduler nwill contact you within 24-48 hours to schedule your surgery.  Please have the Blue surgery sheet available when Angie calls.    Inguinal Hernia, Adult An inguinal hernia is when fat or your intestines push through a weak spot in a muscle where your leg meets your lower belly (groin). This causes a rounded lump (bulge). This kind of hernia could also be:  In your scrotum, if you are male.  In folds of skin around your vagina, if you are male. There are three types of inguinal hernias. These include:  Hernias that can be pushed back into the belly (are reducible). This type rarely causes pain.  Hernias that cannot be pushed back into the belly (are incarcerated).  Hernias that cannot be pushed back into the belly and lose their blood supply (are strangulated). This type needs emergency surgery. If you do not have symptoms, you may not need treatment. If you have symptoms or a large hernia, you may need surgery. Follow these instructions at home: Lifestyle  Do these things if told by your doctor so you do not have trouble pooping (constipation): ? Drink enough fluid to keep your pee (urine) pale yellow. ? Eat foods that have a lot of fiber. These include fresh fruits and vegetables, whole grains, and beans. ? Limit foods that are high in fat and processed sugars. These include foods that are fried or sweet. ? Take medicine for trouble pooping.  Avoid lifting heavy objects.  Avoid standing for long amounts of time.  Do not use any products that contain nicotine or tobacco. These include cigarettes and e-cigarettes. If you need help quitting, ask your doctor.  Stay at a healthy weight. General instructions  You may try to push your hernia in by very gently pressing on it when you are lying down. Do not try to force the bulge back in if it will not push in easily.  Watch your hernia for any changes in shape, size, or color. Tell your doctor if you see any  changes.  Take over-the-counter and prescription medicines only as told by your doctor.  Keep all follow-up visits as told by your doctor. This is important. Contact a doctor if:  You have a fever.  You have new symptoms.  Your symptoms get worse. Get help right away if:  The area where your leg meets your lower belly has: ? Pain that gets worse suddenly. ? A bulge that gets bigger suddenly, and it does not get smaller after that. ? A bulge that turns red or purple. ? A bulge that is painful when you touch it.  You are a man, and your scrotum: ? Suddenly feels painful. ? Suddenly changes in size.  You cannot push the hernia in by very gently pressing on it when you are lying down. Do not try to force the bulge back in if it will not push in easily.  You feel sick to your stomach (nauseous), and that feeling does not go away.  You throw up (vomit), and that keeps happening.  You have a fast heartbeat.  You cannot poop (have a bowel movement) or pass gas. These symptoms may be an emergency. Do not wait to see if the symptoms will go away. Get medical help right away. Call your local emergency services (911 in the [place] Summary  An inguinal hernia is when fat or your intestines push through a weak spot in a muscle where your leg meets your lower belly (groin).  This causes a rounded lump (bulge).  If you do not have symptoms, you may not need treatment. If you have symptoms or a large hernia, you may need surgery.  Avoid lifting heavy objects. Also avoid standing for long amounts of time.  Do not try to force the bulge back in if it will not push in easily. This information is not intended to replace advice given to you by your health care provider. Make sure you discuss any questions you have with your health care provider. Document Released: 08/19/2006 Document Revised: 08/20/2017 Document Reviewed: 04/20/2017 Elsevier Patient Education  [employer].  Umbilical  Hernia, Adult  A hernia is a bulge of tissue that pushes through an opening between muscles. An umbilical hernia happens in the abdomen, near the belly button (umbilicus). The hernia may contain tissues from the small intestine, large intestine, or fatty tissue covering the intestines (omentum). Umbilical hernias in adults tend to get worse over time, and they require surgical treatment. There are several types of umbilical hernias. You may have:  A hernia located just above or below the umbilicus (indirect hernia). This is the most common type of umbilical hernia in adults.  A hernia that forms through an opening formed by the umbilicus (direct hernia).  A hernia that comes and goes (reducible hernia). A reducible hernia may be visible only when you strain, lift something heavy, or cough. This type of hernia can be pushed back into the abdomen (reduced).  A hernia that traps abdominal tissue inside the hernia (incarcerated hernia). This type of hernia cannot be reduced.  A hernia that cuts off blood flow to the tissues inside the hernia (strangulated hernia). The tissues can start to die if this happens. This type of hernia requires emergency treatment. What are the causes? An umbilical hernia happens when tissue inside the abdomen presses on a weak area of the abdominal muscles. What increases the risk? You may have a greater risk of this condition if you:  Are obese.  Have had several pregnancies.  Have a buildup of fluid inside your abdomen (ascites).  Have had surgery that weakens the abdominal muscles. What are the signs or symptoms? The main symptom of this condition is a painless bulge at or near the belly button. A reducible hernia may be visible only when you strain, lift something heavy, or cough. Other symptoms may include:  Dull pain.  A feeling of pressure. Symptoms of a strangulated hernia may include:  Pain that gets increasingly worse.  Nausea and vomiting.   Pain when pressing on the hernia.  Skin over the hernia becoming red or purple.  Constipation.  Blood in the stool. How is this diagnosed? This condition may be diagnosed based on:  A physical exam. You may be asked to cough or strain while standing. These actions increase the pressure inside your abdomen and force the hernia through the opening in your muscles. Your health care provider may try to reduce the hernia by pressing on it.  Your symptoms and medical history. How is this treated? Surgery is the only treatment for an umbilical hernia. Surgery for a strangulated hernia is done as soon as possible. If you have a small hernia that is not incarcerated, you may need to lose weight before having surgery. Follow these instructions at home:  Lose weight, if told by your health care provider.  Do not try to push the hernia back in.  Watch your hernia for any changes in color or size.  Tell your health care provider if any changes occur.  You may need to avoid activities that increase pressure on your hernia.  Do not lift anything that is heavier than 10 lb (4.5 kg) until your health care provider says that this is safe.  Take over-the-counter and prescription medicines only as told by your health care provider.  Keep all follow-up visits as told by your health care provider. This is important. Contact a health care provider if:  Your hernia gets larger.  Your hernia becomes painful. Get help right away if:  You develop sudden, severe pain near the area of your hernia.  You have pain as well as nausea or vomiting.  You have pain and the skin over your hernia changes color.  You develop a fever. This information is not intended to replace advice given to you by your health care provider. Make sure you discuss any questions you have with your health care provider. Document Released: 12/19/2015 Document Revised: 08/31/2017 Document Reviewed: 01/17/2017 Elsevier Patient  Education  2020 [employer]

## 2019-05-03 ENCOUNTER — Encounter: Payer: Self-pay | Admitting: Surgery

## 2019-05-03 NOTE — Progress Notes (Signed)
Patient ID: Ronald Vega, male   DOB: 07-Jan-1986, 33 y.o.   MRN: [id number]  HPI Ronald Vega is a 33 y.o. male seen in consultation at the request of Dr. Lonna Cobb for a symptomatic hernia.  He reports that he has this hernia for at least a couple of years.  For the last month or so started to become symptomatic.  He does feel a bulge on the left groin.  He states that he is got some pain and discomfort on that side the pain is mild intermittent and worsening with Valsalva.  No evidence of incarceration or strangulation.  He is a Pharmacist, hospital.  [name].  He denies any previous abdominal or hernia operations.  He also wishes to have vasectomy at the same time of surgery  HPI  History reviewed. No pertinent past medical history.  Past Surgical History:  Procedure Laterality Date  . KNEE SURGERY Right   . left leg surgery      History reviewed. No pertinent family history.  Social History Social History   Tobacco Use  . Smoking status: Never Smoker  . Smokeless tobacco: Never Used  Substance Use Topics  . Alcohol use: Yes    Comment: rare  . Drug use: No    No Known Allergies  No current outpatient medications on file.   No current facility-administered medications for this visit.      Review of Systems Full ROS  was asked and was negative except for the information on the HPI  Physical Exam Blood pressure 132/88, pulse 86, temperature 97.9 F (36.6 C), temperature source Temporal, resp. rate 14, height 5\' 9"  (1.753 m), weight 180 lb 9.6 oz (81.9 kg), SpO2 99 %. CONSTITUTIONAL: NAD EYES: Pupils are equal, round, and reactive to light, Sclera are non-icteric. EARS, NOSE, MOUTH AND THROAT: The oropharynx is clear. The oral mucosa is pink and moist. Hearing is intact to voice. LYMPH NODES:  Lymph nodes in the neck are normal. RESPIRATORY:  Lungs are clear. There is normal  respiratory effort, with equal breath sounds bilaterally, and without pathologic use of accessory muscles. CARDIOVASCULAR: Heart is regular without murmurs, gallops, or rubs. GI: The abdomen is soft, nontender, and nondistended.  There is evidence of a reducible umbilical hernia and also evidence of a reducible left inguinal hernia. There are no palpable masses. There is no hepatosplenomegaly. There are normal bowel sounds in all quadrants. GU: Rectal deferred.   MUSCULOSKELETAL: Normal muscle strength and tone. No cyanosis or edema.   SKIN: Turgor is good and there are no pathologic skin lesions or ulcers. NEUROLOGIC: Motor and sensation is grossly normal. Cranial nerves are grossly intact. PSYCH:  Oriented to person, place and time. Affect is normal.  Data Reviewed  I have personally reviewed the patient's imaging, laboratory findings and medical records.    Assessment/Plan 33 year old male with a symptomatic left inguinal hernia and an incidental umbilical hernia.  I had a lengthy discussion with patient regarding the natural history of inguinal hernias given his symptoms I definitely recommend repair.  I also discussed with him the different approaches including open approach versus robotic assisted laparoscopic approach.  I am in favor of a robotic approach and he agrees with me.  We can also address the umbilical hernia at the same time.  Procedure discussed with the patient in detail.  Risk benefit and possible complications including but not  limited to: Bleeding, infection recurrence, chronic pain and mesh issues.  He understands and wishes to proceed.  We will coordinate the procedure to have Dr. Bernardo Heater of perform a vasectomy as well during the same operative setting. A copy was sent to referring provider.  Caroleen Hamman, MD FACS General Surgeon 05/03/2019, 7:42 AM

## 2019-06-11 ENCOUNTER — Telehealth: Payer: Self-pay | Admitting: *Deleted

## 2019-06-11 NOTE — Telephone Encounter (Signed)
Patient called and stated that he is scheduled for surgery on 06/19/19 but tested positive last Tuesday or Wednesday. Does he need to be rescheduled or will he be fine to proceed with surgery.

## 2019-06-12 ENCOUNTER — Other Ambulatory Visit: Admission: RE | Admit: 2019-06-12 | Payer: Self-pay | Source: Ambulatory Visit

## 2019-06-15 ENCOUNTER — Other Ambulatory Visit: Payer: Self-pay

## 2019-06-25 NOTE — Telephone Encounter (Signed)
Patient stated he is currently at work and would like to call back after 1pm today for surgery information.

## 2019-06-25 NOTE — Telephone Encounter (Signed)
Please call patient with the information below.   Surgery Date: 07/10/19 with Dr Kris Mouton assisted inguinal hernia repair and umbilical hernia repair-Dr Bernardo Heater will be starting the case with Vasectomy.  Preadmission Testing Date: 07/06/19 - phone interview between 1+5:00pm.  Covid Testing Date: Pt will not need to be tested for Covid per Caryl Pina with Preadmission testing, due to recent positive result.  [facility]mmi Video sent via [facility] Surgical Video and Covid Education Video.  Patient has been made aware to call [phone], between 1-3:00pm the day before surgery, to find out what time to arrive.

## 2019-06-26 NOTE — Telephone Encounter (Signed)
Patient called back and is aware of surgery information.

## 2019-07-02 ENCOUNTER — Telehealth: Payer: Self-pay | Admitting: Surgery

## 2019-07-02 NOTE — Telephone Encounter (Signed)
Patient has been advised that Dr Dahlia Byes is not in the office until 12/14 and is ok with Dr Christian Mate performing the surgery.   Pt has been advised of pre admission date/time, Covid Testing date and Surgery date.  Surgery Date: 07/10/19 with Dr Wyline Beady assisted inguinal hernia repair and umbilical-Stoioff performing vasectomy first.  Preadmission Testing Date: 07/06/19 between 1-5:00pm-phone interview.  Covid Testing Date: does not need - patient advised to go to the [facility]  [employer] Video sent via [employer] Surgical Video and [employer].  Patient has been made aware to call [phone], between 1-3:00pm the day before surgery, to find out what time to arrive.

## 2019-07-06 ENCOUNTER — Encounter: Payer: Self-pay | Admitting: *Deleted

## 2019-07-06 ENCOUNTER — Other Ambulatory Visit: Payer: Self-pay

## 2019-07-06 ENCOUNTER — Encounter
Admission: RE | Admit: 2019-07-06 | Discharge: 2019-07-06 | Disposition: A | Discharge status: Home / Self Care | Payer: Self-pay | Source: Ambulatory Visit | Attending: Surgery | Admitting: Surgery

## 2019-07-06 ENCOUNTER — Other Ambulatory Visit: Payer: Self-pay | Admitting: Urology

## 2019-07-06 NOTE — Pre-Procedure Instructions (Signed)
Unable to locate positive Covid results in our system or care everywhere.  Patient states he was tested at [facility] on 06/06/19.  Requested him to get something in writing to bring with him on surgery day.  If he is unable to locate some documentation this weekend, then he would need to be at [facility] at 8 am on 07/09/19.  He states his understanding and will either provide documentation or will be here.

## 2019-07-06 NOTE — Patient Instructions (Signed)
INSTRUCTIONS FOR SURGERY     Your surgery is scheduled for:   Tuesday, December 8TH     To find out your arrival time for the day of surgery,          please call [phone] between 1 pm and 3 pm on :   Monday, December 7TH     When you arrive for surgery, report to the SECOND FLOOR OF THE MEDICAL MALL.       Do NOT stop on the first floor to register.    REMEMBER: Instructions that are not followed completely may result in serious medical risk,  up to and including death, or upon the discretion of your surgeon and anesthesiologist,            your surgery may need to be rescheduled.  __X__ 1. Do not eat food after midnight the night before your procedure.                    No gum, candy, lozenger, tic tacs, tums or hard candies.                  ABSOLUTELY NOTHING SOLID IN YOUR MOUTH AFTER MIDNIGHT                    You may drink unlimited clear liquids up to 2 hours before you are scheduled to arrive for surgery.                   Do not drink anything within those 2 hours unless you need to take medicine, then take the                   smallest amount you need.  Clear liquids include:  water, apple juice without pulp,                   any flavor Gatorade, Black coffee, black tea.  Sugar may be added but no dairy/ honey /lemon.                        Broth and jello is not considered a clear liquid.  __x__  2. On the morning of surgery, please brush your teeth with toothpaste and water. You may rinse with                  mouthwash if you wish but DO NOT SWALLOW TOOTHPASTE OR MOUTHWASH  __X___3. NO alcohol for 24 hours before or after surgery.  __x___ 4.  Do NOT smoke or use e-cigarettes for 24 HOURS PRIOR TO SURGERY.                      DO NOT Use any chewable tobacco products for at least 6 hours prior to surgery.  __x___ 5. If you start any new medication after this appointment and prior to surgery, please         Bring it with you on the day of surgery.  ___x__ 6. Notify your doctor if there is any change in your medical condition, such as fever, infection, vomitting,  Diarrhea or any open sores.  __x___ 7.  USE the CHG SOAP as instructed, the night before surgery and the day of surgery.                   Once you have washed with this soap, do NOT use any of the following: Powders, perfumes                    or lotions. Please do not wear make up, hairpins, clips or nail polish. You MAY wear deodorant.                   Men may shave their face and neck.  Women need to shave 48 hours prior to surgery.                   DO NOT wear ANY jewelry on the day of surgery. If there are rings that are too tight to                    remove easily, please address this prior to the surgery day. Piercings need to be removed.                                                                     NO METAL ON YOUR BODY.                    Do NOT bring any valuables.  If you came to Pre-Admit testing then you will not need license,                     insurance card or credit card.  If you will be staying overnight, please either leave your things in                     the car or have your family be responsible for these items.                     [facility] IS NOT RESPONSIBLE FOR BELONGINGS OR VALUABLES.  ___X__ 8. DO NOT wear contact lenses on surgery day.  You may not have dentures,                     Hearing aides, contacts or glasses in the operating room. These items can be                    Placed in the Recovery Room to receive immediately after surgery.  __x___ 9. IF YOU ARE SCHEDULED TO GO HOME ON THE SAME DAY, YOU MUST                   Have someone to drive you home and to stay with you  for the first 24 hours.                    Have an arrangement prior to arriving on surgery day.  ___x__ 10. Take the following medications on the morning of surgery with a sip of water:  1.  NONE                     2.                     3.                     4.                     5.                     6.  _____ 11.  Follow any instructions provided to you by your surgeon.                        Such as enema, clear liquid bowel prep  __X__  12. STOP  ASPIRIN AS OF: TODAY, December 4TH                       THIS INCLUDES BC POWDERS / GOODIES POWDER  __x___ 13. STOP Anti-inflammatories as of: TODAY, December 4TH                      This includes IBUPROFEN / MOTRIN / ADVIL / ALEVE/ NAPROXYN                    YOU MAY TAKE TYLENOL ANY TIME PRIOR TO SURGERY.  _____ 14.  Stop supplements until after surgery.                     This includes:  N/A                 You may continue taking Vitamin B12 / Vitamin D3 and Multivitamins but do not take on the morning of surgery.  _____ 15. Bring your CPAP machine into preop with you on the morning of surgery.  ______17.  Continue to take the following medications but do not take on the morning of surgery:                                      multivitamins  ______18. If staying overnight, please have appropriate shoes to wear to be able to walk around the unit.                   Wear clean and comfortable clothing to the hospital.  [place] . HAVE STOOL SOFTENERS AVAILABLE AT HOME. CONSIDER TAKING THE STOOL SOFTENERS A FEW DAYS         PRIOR TO SURGERY.

## 2019-07-10 ENCOUNTER — Ambulatory Visit: Payer: Self-pay | Admitting: Anesthesiology

## 2019-07-10 ENCOUNTER — Telehealth: Payer: Self-pay | Admitting: Urology

## 2019-07-10 ENCOUNTER — Other Ambulatory Visit: Payer: Self-pay

## 2019-07-10 ENCOUNTER — Encounter
Admission: RE | Disposition: A | Discharge status: Home / Self Care | Payer: Self-pay | Source: Home / Self Care | Attending: Surgery

## 2019-07-10 ENCOUNTER — Ambulatory Visit
Admission: RE | Admit: 2019-07-10 | Discharge: 2019-07-10 | Disposition: A | Discharge status: Home / Self Care | Payer: Self-pay | Attending: Surgery | Admitting: Surgery

## 2019-07-10 DIAGNOSIS — Z9852 Vasectomy status: Secondary | ICD-10-CM

## 2019-07-10 DIAGNOSIS — K4090 Unilateral inguinal hernia, without obstruction or gangrene, not specified as recurrent: Secondary | ICD-10-CM | POA: Insufficient documentation

## 2019-07-10 DIAGNOSIS — Z8719 Personal history of other diseases of the digestive system: Secondary | ICD-10-CM | POA: Insufficient documentation

## 2019-07-10 DIAGNOSIS — Z9889 Other specified postprocedural states: Secondary | ICD-10-CM

## 2019-07-10 DIAGNOSIS — Z302 Encounter for sterilization: Secondary | ICD-10-CM | POA: Insufficient documentation

## 2019-07-10 HISTORY — PX: VASECTOMY: SHX75

## 2019-07-10 HISTORY — DX: Other specified postprocedural states: Z98.890

## 2019-07-10 HISTORY — PX: XI ROBOTIC ASSISTED INGUINAL HERNIA REPAIR WITH MESH: SHX6706

## 2019-07-10 HISTORY — PX: UMBILICAL HERNIA REPAIR: SHX196

## 2019-07-10 HISTORY — DX: Other specified postprocedural states: Z87.19

## 2019-07-10 SURGERY — REPAIR, HERNIA, INGUINAL, ROBOT-ASSISTED, LAPAROSCOPIC, USING MESH
Anesthesia: General

## 2019-07-10 MED ORDER — LIDOCAINE HCL (CARDIAC) PF 100 MG/5ML IV SOSY
PREFILLED_SYRINGE | INTRAVENOUS | Status: DC | PRN
  Administered 2019-07-10: 100 mg via INTRAVENOUS

## 2019-07-10 MED ORDER — FENTANYL CITRATE (PF) 100 MCG/2ML IJ SOLN
INTRAMUSCULAR | Status: DC | PRN
  Administered 2019-07-10 (×2): 25 ug via INTRAVENOUS
  Administered 2019-07-10: 50 ug via INTRAVENOUS

## 2019-07-10 MED ORDER — ROCURONIUM BROMIDE 50 MG/5ML IV SOLN
INTRAVENOUS | Status: AC
  Filled 2019-07-10: qty 2

## 2019-07-10 MED ORDER — CEFAZOLIN SODIUM-DEXTROSE 2-4 GM/100ML-% IV SOLN
INTRAVENOUS | Status: AC
  Filled 2019-07-10: qty 100

## 2019-07-10 MED ORDER — FAMOTIDINE 20 MG PO TABS
ORAL_TABLET | ORAL | Status: AC
  Administered 2019-07-10: 20 mg via ORAL
  Filled 2019-07-10: qty 1

## 2019-07-10 MED ORDER — ACETAMINOPHEN 500 MG PO TABS
1000.0000 mg | ORAL_TABLET | ORAL | Status: AC
  Administered 2019-07-10: 07:00:00 1000 mg via ORAL

## 2019-07-10 MED ORDER — LIDOCAINE HCL 1 % IJ SOLN
INTRAMUSCULAR | Status: DC | PRN
  Administered 2019-07-10: 8 mL

## 2019-07-10 MED ORDER — NEOSTIGMINE METHYLSULFATE 10 MG/10ML IV SOLN
INTRAVENOUS | Status: AC
  Filled 2019-07-10: qty 1

## 2019-07-10 MED ORDER — FAMOTIDINE 20 MG PO TABS
20.0000 mg | ORAL_TABLET | Freq: Once | ORAL | Status: AC
  Administered 2019-07-10: 07:00:00 20 mg via ORAL

## 2019-07-10 MED ORDER — PHENYLEPHRINE HCL (PRESSORS) 10 MG/ML IV SOLN
INTRAVENOUS | Status: AC
  Filled 2019-07-10: qty 1

## 2019-07-10 MED ORDER — MIDAZOLAM HCL 2 MG/2ML IJ SOLN
INTRAMUSCULAR | Status: AC
  Filled 2019-07-10: qty 2

## 2019-07-10 MED ORDER — DEXMEDETOMIDINE HCL 200 MCG/2ML IV SOLN
INTRAVENOUS | Status: DC | PRN
  Administered 2019-07-10: 8 ug via INTRAVENOUS

## 2019-07-10 MED ORDER — GABAPENTIN 300 MG PO CAPS
300.0000 mg | ORAL_CAPSULE | ORAL | Status: AC
  Administered 2019-07-10: 07:00:00 300 mg via ORAL

## 2019-07-10 MED ORDER — LIDOCAINE HCL (PF) 1 % IJ SOLN
INTRAMUSCULAR | Status: AC
  Filled 2019-07-10: qty 30

## 2019-07-10 MED ORDER — ROCURONIUM BROMIDE 100 MG/10ML IV SOLN
INTRAVENOUS | Status: DC | PRN
  Administered 2019-07-10: 20 mg via INTRAVENOUS
  Administered 2019-07-10 (×2): 10 mg via INTRAVENOUS
  Administered 2019-07-10: 20 mg via INTRAVENOUS
  Administered 2019-07-10: 50 mg via INTRAVENOUS

## 2019-07-10 MED ORDER — CHLORHEXIDINE GLUCONATE CLOTH 2 % EX PADS
6.0000 | MEDICATED_PAD | Freq: Once | CUTANEOUS | Status: AC
  Administered 2019-07-10: 6 via TOPICAL

## 2019-07-10 MED ORDER — SUCCINYLCHOLINE CHLORIDE 20 MG/ML IJ SOLN
INTRAMUSCULAR | Status: AC
  Filled 2019-07-10: qty 1

## 2019-07-10 MED ORDER — ACETAMINOPHEN 500 MG PO TABS
ORAL_TABLET | ORAL | Status: AC
  Administered 2019-07-10: 1000 mg via ORAL
  Filled 2019-07-10: qty 2

## 2019-07-10 MED ORDER — LACTATED RINGERS IV SOLN
INTRAVENOUS | Status: DC
  Administered 2019-07-10 (×2): via INTRAVENOUS

## 2019-07-10 MED ORDER — GABAPENTIN 300 MG PO CAPS
ORAL_CAPSULE | ORAL | Status: AC
  Administered 2019-07-10: 300 mg via ORAL
  Filled 2019-07-10: qty 1

## 2019-07-10 MED ORDER — LIDOCAINE HCL (PF) 2 % IJ SOLN
INTRAMUSCULAR | Status: AC
  Filled 2019-07-10: qty 10

## 2019-07-10 MED ORDER — PHENYLEPHRINE HCL (PRESSORS) 10 MG/ML IV SOLN
INTRAVENOUS | Status: DC | PRN
  Administered 2019-07-10 (×3): 100 ug via INTRAVENOUS

## 2019-07-10 MED ORDER — BUPIVACAINE-EPINEPHRINE (PF) 0.25% -1:200000 IJ SOLN
INTRAMUSCULAR | Status: DC | PRN
  Administered 2019-07-10: 20 mL

## 2019-07-10 MED ORDER — PHENYLEPHRINE HCL-NACL 10-0.9 MG/250ML-% IV SOLN
INTRAVENOUS | Status: DC | PRN
  Administered 2019-07-10: 50 ug/min via INTRAVENOUS

## 2019-07-10 MED ORDER — CELECOXIB 200 MG PO CAPS
ORAL_CAPSULE | ORAL | Status: AC
  Administered 2019-07-10: 200 mg via ORAL
  Filled 2019-07-10: qty 1

## 2019-07-10 MED ORDER — FENTANYL CITRATE (PF) 100 MCG/2ML IJ SOLN
25.0000 ug | INTRAMUSCULAR | Status: DC | PRN
  Administered 2019-07-10 (×4): 25 ug via INTRAVENOUS

## 2019-07-10 MED ORDER — NEOSTIGMINE METHYLSULFATE 10 MG/10ML IV SOLN
INTRAVENOUS | Status: DC | PRN
  Administered 2019-07-10: 4 mg via INTRAVENOUS

## 2019-07-10 MED ORDER — FENTANYL CITRATE (PF) 100 MCG/2ML IJ SOLN
INTRAMUSCULAR | Status: AC
  Administered 2019-07-10: 25 ug via INTRAVENOUS
  Filled 2019-07-10: qty 2

## 2019-07-10 MED ORDER — ONDANSETRON HCL 4 MG/2ML IJ SOLN
4.0000 mg | Freq: Once | INTRAMUSCULAR | Status: AC | PRN
  Administered 2019-07-10: 4 mg via INTRAVENOUS

## 2019-07-10 MED ORDER — ONDANSETRON HCL 4 MG/2ML IJ SOLN
INTRAMUSCULAR | Status: AC
  Filled 2019-07-10: qty 2

## 2019-07-10 MED ORDER — BUPIVACAINE-EPINEPHRINE (PF) 0.25% -1:200000 IJ SOLN
INTRAMUSCULAR | Status: AC
  Filled 2019-07-10: qty 30

## 2019-07-10 MED ORDER — IBUPROFEN 800 MG PO TABS: 800.0000 mg | ORAL_TABLET | Freq: Three times a day (TID) | ORAL | 0 refills | Status: DC | PRN

## 2019-07-10 MED ORDER — CEFAZOLIN SODIUM-DEXTROSE 2-4 GM/100ML-% IV SOLN
2.0000 g | INTRAVENOUS | Status: AC
  Administered 2019-07-10: 2 g via INTRAVENOUS

## 2019-07-10 MED ORDER — HYDROCODONE-ACETAMINOPHEN 5-325 MG PO TABS: 2.0000 | ORAL_TABLET | Freq: Four times a day (QID) | ORAL | 0 refills | Status: DC | PRN

## 2019-07-10 MED ORDER — PROPOFOL 10 MG/ML IV BOLUS
INTRAVENOUS | Status: AC
  Filled 2019-07-10: qty 60

## 2019-07-10 MED ORDER — PROPOFOL 10 MG/ML IV BOLUS
INTRAVENOUS | Status: DC | PRN
  Administered 2019-07-10: 160 mg via INTRAVENOUS

## 2019-07-10 MED ORDER — GLYCOPYRROLATE 0.2 MG/ML IJ SOLN
INTRAMUSCULAR | Status: AC
  Filled 2019-07-10: qty 3

## 2019-07-10 MED ORDER — CELECOXIB 200 MG PO CAPS
200.0000 mg | ORAL_CAPSULE | ORAL | Status: AC
  Administered 2019-07-10: 07:00:00 200 mg via ORAL

## 2019-07-10 MED ORDER — HYDROCODONE-ACETAMINOPHEN 5-325 MG PO TABS
1.0000 | ORAL_TABLET | Freq: Four times a day (QID) | ORAL | Status: DC | PRN
  Administered 2019-07-10: 1 via ORAL

## 2019-07-10 MED ORDER — HYDROCODONE-ACETAMINOPHEN 5-325 MG PO TABS
ORAL_TABLET | ORAL | Status: AC
  Filled 2019-07-10: qty 1

## 2019-07-10 MED ORDER — ONDANSETRON HCL 4 MG/2ML IJ SOLN
INTRAMUSCULAR | Status: DC | PRN
  Administered 2019-07-10: 4 mg via INTRAVENOUS

## 2019-07-10 MED ORDER — DEXAMETHASONE SODIUM PHOSPHATE 10 MG/ML IJ SOLN
INTRAMUSCULAR | Status: AC
  Filled 2019-07-10: qty 1

## 2019-07-10 MED ORDER — DEXMEDETOMIDINE HCL IN NACL 80 MCG/20ML IV SOLN
INTRAVENOUS | Status: AC
  Filled 2019-07-10: qty 20

## 2019-07-10 MED ORDER — MIDAZOLAM HCL 2 MG/2ML IJ SOLN
INTRAMUSCULAR | Status: DC | PRN
  Administered 2019-07-10: 2 mg via INTRAVENOUS

## 2019-07-10 MED ORDER — GLYCOPYRROLATE 0.2 MG/ML IJ SOLN
INTRAMUSCULAR | Status: DC | PRN
  Administered 2019-07-10: 0.6 mg via INTRAVENOUS

## 2019-07-10 MED ORDER — FENTANYL CITRATE (PF) 250 MCG/5ML IJ SOLN
INTRAMUSCULAR | Status: AC
  Filled 2019-07-10: qty 5

## 2019-07-10 MED ORDER — DEXAMETHASONE SODIUM PHOSPHATE 10 MG/ML IJ SOLN
INTRAMUSCULAR | Status: DC | PRN
  Administered 2019-07-10: 8 mg via INTRAVENOUS

## 2019-07-10 SURGICAL SUPPLY — 61 items
BLADE CLIPPER SURG (BLADE) ×8 IMPLANT
BLADE SURG SZ11 CARB STEEL (BLADE) ×4 IMPLANT
CANISTER SUCT 1200ML W/VALVE (MISCELLANEOUS) ×8 IMPLANT
CHLORAPREP W/TINT 26 (MISCELLANEOUS) ×8 IMPLANT
COVER TIP SHEARS 8 DVNC (MISCELLANEOUS) ×2 IMPLANT
COVER TIP SHEARS 8MM DA VINCI (MISCELLANEOUS) ×2
COVER WAND RF STERILE (DRAPES) ×8 IMPLANT
DEFOGGER SCOPE WARMER CLEARIFY (MISCELLANEOUS) ×4 IMPLANT
DERMABOND ADVANCED (GAUZE/BANDAGES/DRESSINGS) ×2
DERMABOND ADVANCED .7 DNX12 (GAUZE/BANDAGES/DRESSINGS) ×2 IMPLANT
DRAPE 3/4 80X56 (DRAPES) ×4 IMPLANT
DRAPE ARM DVNC X/XI (DISPOSABLE) ×6 IMPLANT
DRAPE COLUMN DVNC XI (DISPOSABLE) ×2 IMPLANT
DRAPE DA VINCI XI ARM (DISPOSABLE) ×6
DRAPE DA VINCI XI COLUMN (DISPOSABLE) ×2
DRAPE LAPAROTOMY 77X122 PED (DRAPES) ×4 IMPLANT
DRSG GAUZE FLUFF 36X18 (GAUZE/BANDAGES/DRESSINGS) ×4 IMPLANT
DRSG TELFA 4X3 1S NADH ST (GAUZE/BANDAGES/DRESSINGS) ×4 IMPLANT
ELECT CAUTERY NEEDLE TIP 1.0 (MISCELLANEOUS) ×4
ELECT REM PT RETURN 9FT ADLT (ELECTROSURGICAL) ×8
ELECTRODE CAUTERY NEDL TIP 1.0 (MISCELLANEOUS) ×2 IMPLANT
ELECTRODE REM PT RTRN 9FT ADLT (ELECTROSURGICAL) ×4 IMPLANT
GAUZE SPONGE 4X4 12PLY STRL (GAUZE/BANDAGES/DRESSINGS) ×4 IMPLANT
GLOVE BIO SURGEON STRL SZ8 (GLOVE) ×4 IMPLANT
GLOVE ORTHO TXT STRL SZ7.5 (GLOVE) ×12 IMPLANT
GOWN STRL REUS W/ TWL LRG LVL3 (GOWN DISPOSABLE) ×8 IMPLANT
GOWN STRL REUS W/ TWL XL LVL3 (GOWN DISPOSABLE) ×2 IMPLANT
GOWN STRL REUS W/TWL LRG LVL3 (GOWN DISPOSABLE) ×8
GOWN STRL REUS W/TWL XL LVL3 (GOWN DISPOSABLE) ×2
IRRIGATION STRYKERFLOW (MISCELLANEOUS) IMPLANT
IRRIGATOR STRYKERFLOW (MISCELLANEOUS)
IV CATH ANGIO 12GX3 LT BLUE (NEEDLE) ×4 IMPLANT
IV CATH ANGIO 14GX1.88 NO SAFE (IV SOLUTION) IMPLANT
IV NS 1000ML (IV SOLUTION)
IV NS 1000ML BAXH (IV SOLUTION) IMPLANT
KIT PINK PAD W/HEAD ARE REST (MISCELLANEOUS) ×4
KIT PINK PAD W/HEAD ARM REST (MISCELLANEOUS) ×2 IMPLANT
KIT TURNOVER KIT A (KITS) ×4 IMPLANT
LABEL OR SOLS (LABEL) ×8 IMPLANT
MESH 3DMAX LIGHT 4.1X6.2 LT LR (Mesh General) ×4 IMPLANT
NEEDLE HYPO 22GX1.5 SAFETY (NEEDLE) ×4 IMPLANT
NEEDLE HYPO 25X1 1.5 SAFETY (NEEDLE) ×4 IMPLANT
NEEDLE INSUFFLATION 14GA 120MM (NEEDLE) ×4 IMPLANT
NS IRRIG 500ML POUR BTL (IV SOLUTION) ×4 IMPLANT
PACK BASIN MINOR ARMC (MISCELLANEOUS) ×4 IMPLANT
PACK LAP CHOLECYSTECTOMY (MISCELLANEOUS) ×4 IMPLANT
SEAL CANN UNIV 5-8 DVNC XI (MISCELLANEOUS) ×6 IMPLANT
SEAL XI 5MM-8MM UNIVERSAL (MISCELLANEOUS) ×6
SET TUBE SMOKE EVAC HIGH FLOW (TUBING) ×4 IMPLANT
SOLUTION ELECTROLUBE (MISCELLANEOUS) ×4 IMPLANT
SUPPORETR ATHLETIC LG (MISCELLANEOUS) ×4 IMPLANT
SUPPORTER ATHLETIC LG (MISCELLANEOUS) ×8
SUT CHROMIC 3 0 PS 2 (SUTURE) ×8 IMPLANT
SUT MNCRL AB 4-0 PS2 18 (SUTURE) ×4 IMPLANT
SUT VIC AB 2-0 SH 27 (SUTURE) ×2
SUT VIC AB 2-0 SH 27XBRD (SUTURE) ×2 IMPLANT
SUT VICRYL 0 AB UR-6 (SUTURE) ×4 IMPLANT
SUT VLOC 90 6 CV-15 VIOLET (SUTURE) ×3 IMPLANT
SUT VLOC 90 6" CV-15 VIOLET (SUTURE) ×1
SUT VLOC 90 S/L VL9 GS22 (SUTURE) ×4 IMPLANT
SYR 10ML LL (SYRINGE) ×4 IMPLANT

## 2019-07-10 NOTE — Telephone Encounter (Signed)
He just had the vas done this morning in the OR, Stoioff's note he just needs a post op vas sample in 12 weeks. I have placed the order. thanks

## 2019-07-10 NOTE — Op Note (Signed)
Robotic assisted Laparoscopic Transabdominal Left Inguinal Hernia Repair with Mesh       Pre-operative Diagnosis:  Left Inguinal Hernia   Post-operative Diagnosis: Same   Procedure: Robotic Laparoscopic repair of left inguinal hernia(s)   Surgeon: [name], MD FACS   Anesthesia: Gen. with endotracheal tube   Findings: Large  inguinal hernia, no  evidence of right sided hernia or significant umbilical defect.         Procedure Details  The patient was seen again in the Holding Room. The benefits, complications, treatment options, and expected outcomes were discussed with the patient. The risks of bleeding, infection, recurrence of symptoms, failure to resolve symptoms, recurrence of hernia, ischemic orchitis, chronic pain syndrome or neuroma, were reviewed again. The likelihood of improving the patient's symptoms with return to their baseline status is good.  The patient and/or family concurred with the proposed plan, giving informed consent.  The patient was taken to Operating Room, identified  and the procedure verified as Laparoscopic Inguinal Hernia Repair. Laterality confirmed.  A Time Out was held and the above information confirmed.   Prior to the induction of general anesthesia, antibiotic prophylaxis was administered. VTE prophylaxis was in place. General endotracheal anesthesia was then administered and tolerated well. After the induction, the abdomen was prepped with Chloraprep and draped in the sterile fashion. The patient was positioned in the supine position.   After local infiltration of quarter percent Marcaine with epinephrine, stab incision was made left upper quadrant.  Just below the costal margin approximately midclavicular line the Veress needle is passed with sensation of the layers to penetrate the abdominal wall and into the peritoneum.  Saline drop test is confirmed peritoneal placement.  Insufflation is initiated with carbon dioxide to pressures of 15 mmHg. An  8.5 mm port is placed to the left off of the midline, with blunt tipped trocar.  Pneumoperitoneum maintained w/o HD changes. No evidence of bowel injuries.  Two 8.5 mm ports placed under direct vision. The laparoscopy revealed large left sided inguinal indirect defect.  The robot was brought ot the table and docked in the standard fashion, no collision between arms was observed. Instruments were kept under direct view at all times. For left inguinal hernia repair,  I developed a peritoneal flap. The sac(s) were reduced and dissected free from adjacent structures. A small hydrocele was lysed and drained and the distal sac transected.  We preserved the vas and the vessels, and visualized them to their convergence and beyond in the retroperitoneum. Once dissection was completed a large right sided BARD 3D Light mesh was placed and secured at three points with interrupted 2-0 Vicryl to the pubic tubercle and anteriorly. There was good coverage of the direct, indirect and femoral spaces.     A large angiocath is placed under direct visualization in the groin to reduce trapped extraperitoneal air and confirm adequate peritoneal closure.  Second look revealed no complications or injuries. The flap was closed with 3-0 Stratafix suture.   Once assuring that hemostasis was adequate the ports were removed.  4-0 subcuticular Monocryl was used at all skin edges. Dermabond was placed.  Patient tolerated the procedure well. There were no complications. He was taken to the recovery room in stable condition.           [name], MD, FACS

## 2019-07-10 NOTE — Progress Notes (Signed)
33 y.o. male scheduled for inguinal and umbilical hernia repairs. He has also requested vasectomy.  We had a long discussion about vasectomy. We specifically discussed the procedure, recovery and the risks, benefits and alternatives of vasectomy. I explained that the procedure entails removal of a segment of each vas deferens, each of which conducts sperm, and that the purpose of this procedure is to cause sterility (inability to produce children or cause pregnancy). Vasectomy is intended to be permanent and irreversible form of contraception. Options for fertility after vasectomy include vasectomy reversal, or sperm retrieval with in vitro fertilization. These options are not always successful, and they may be expensive. We discussed reversible forms of birth control such as condoms, IUD or diaphragms, as well as the option of freezing sperm in a sperm bank prior to the vasectomy procedure. We discussed the importance of avoiding strenuous exercise for four days after vasectomy, and the importance of refraining from any form of ejaculation for seven days after vasectomy. I explained that vasectomy does not produce immediate sterility so another form of contraceptive must be used until sterility is assured by having semen checked for sperm. Thus, a post vasectomy semen analysis is necessary to confirm sterility. Rarely, vasectomy must be repeated. We discussed the approximately 1 in 2,000 risk of pregnancy after vasectomy for men who have post-vasectomy semen analysis showing absent sperm or rare non-motile sperm. Typical side effects include a small amount of oozing blood, some discomfort and mild swelling in the area of incision.  Vasectomy does not affect sexual performance, function, please, sensation, interest, desire, satisfaction, penile erection, volume of semen or ejaculation. Other rare risks include allergy or adverse reaction to an anesthetic, testicular atrophy, hematoma, infection/abscess, prolonged  tenderness of the vas deferens, pain, swelling, painful nodule or scar (called sperm granuloma) or epididymtis. We discussed chronic testicular pain syndrome. This has been reported to occur in as many as 1-2% of men and may be permanent. This can be treated with medication, small procedures or (rarely) surgery.   Nyazia Canevari [employer]. MD

## 2019-07-10 NOTE — Anesthesia Post-op Follow-up Note (Signed)
Anesthesia QCDR form completed.        

## 2019-07-10 NOTE — Transfer of Care (Signed)
Immediate Anesthesia Transfer of Care Note  Patient: Ronald Vega  Procedure(s) Performed: XI ROBOTIC ASSISTED INGUINAL HERNIA REPAIR WITH MESH (Left ) HERNIA REPAIR UMBILICAL ADULT (N/A ) VASECTOMY (N/A )  Patient Location: PACU  Anesthesia Type:General  Level of Consciousness: drowsy and patient cooperative  Airway & Oxygen Therapy: Patient Spontanous Breathing and Patient connected to face mask oxygen  Post-op Assessment: Report given to RN and Post -op Vital signs reviewed and stable  Post vital signs: Reviewed and stable  Last Vitals:  Vitals Value Taken Time  BP 118/59 07/10/19 0956  Temp 36.7 C 07/10/19 0956  Pulse 58 07/10/19 0959  Resp 25 07/10/19 0959  SpO2 100 % 07/10/19 0959  Vitals shown include unvalidated device data.  Last Pain:  Vitals:   07/10/19 0635  TempSrc: Tympanic  PainSc: 0-No pain         Complications: No apparent anesthesia complications

## 2019-07-10 NOTE — H&P (Signed)
HPI Ronald Vega is a 33 y.o. male seen in consultation at the request of Dr. Bernardo Vega for a symptomatic left inguinal hernia.  He reports that he has this hernia for at least a couple of years.  For the last month or so started to become symptomatic.  He does feel a bulge on the left groin.  He states that he is got some pain and discomfort on that side the pain is mild intermittent and worsening with Valsalva.  No evidence of incarceration or strangulation.  [name].  [name].  He denies any previous abdominal or hernia operations.  He also wishes to have vasectomy at the same time of surgery  HPI  History reviewed. No pertinent past medical history.       Past Surgical History:  Procedure Laterality Date  . KNEE SURGERY Right   . left leg surgery      History reviewed. No pertinent family history.  Social History Social History        Tobacco Use  . Smoking status: Never Smoker  . Smokeless tobacco: Never Used  Substance Use Topics  . Alcohol use: Yes    Comment: rare  . Drug use: No    No Known Allergies  No current outpatient medications on file.   No current facility-administered medications for this visit.      Review of Systems Full ROS  was asked and was negative except for the information on the HPI  Physical Exam  CONSTITUTIONAL: NAD EYES: Pupils are equal, round, and reactive to light, Sclera are non-icteric. EARS, NOSE, MOUTH AND THROAT: The oropharynx is clear. The oral mucosa is pink and moist. Hearing is intact to voice. LYMPH NODES:  Lymph nodes in the neck are normal. RESPIRATORY:  Lungs are clear. There is normal respiratory effort, with equal breath sounds bilaterally, and without pathologic use of accessory muscles. CARDIOVASCULAR: Heart is regular without murmurs, gallops, or rubs. GI: The abdomen is soft, nontender, and  nondistended.  There is evidence of a reducible umbilical hernia of 4 mm fascial defect without sac and also evidence of a reducible left inguinal hernia. There are no palpable masses. There is no hepatosplenomegaly. There are normal bowel sounds in all quadrants. GU: Rectal deferred.   MUSCULOSKELETAL: Normal muscle strength and tone. No cyanosis or edema.   SKIN: Turgor is good and there are no pathologic skin lesions or ulcers. NEUROLOGIC: Motor and sensation is grossly normal. Cranial nerves are grossly intact. PSYCH:  Oriented to person, place and time. Affect is normal.  Data Reviewed  I have personally reviewed the patient's imaging, laboratory findings and medical records.    Assessment/Plan 33 year old male with a symptomatic left inguinal hernia and an incidental asymptomatic umbilical hernia.  I am in favor of a robotic approach and he agrees with me.  We could also address the umbilical hernia at the same time, but considering his degree of fitness, and the lack of symptoms and the small size of the defect we may leave alone.  Robotic procedure discussed with the patient in detail.  Risk benefit and possible complications including but not limited to: Bleeding, infection recurrence, chronic pain and mesh issues.  He understands and wishes to proceed.   Dr. Bernardo Vega to perform a vasectomy as first, and I will follow with the [facility].

## 2019-07-10 NOTE — Anesthesia Preprocedure Evaluation (Signed)
Anesthesia Evaluation  Patient identified by MRN, date of birth, ID band Patient awake    Reviewed: Allergy & Precautions, NPO status , Patient's Chart, lab work & pertinent test results  History of Anesthesia Complications Negative for: history of anesthetic complications  Airway Mallampati: I       Dental   Pulmonary neg sleep apnea, neg COPD, Not current smoker,           Cardiovascular (-) hypertension(-) Past MI and (-) CHF (-) dysrhythmias (-) Valvular Problems/Murmurs     Neuro/Psych neg Seizures    GI/Hepatic Neg liver ROS, neg GERD  ,  Endo/Other  neg diabetes  Renal/GU negative Renal ROS     Musculoskeletal   Abdominal   Peds  Hematology   Anesthesia Other Findings   Reproductive/Obstetrics                             Anesthesia Physical Anesthesia Plan  ASA: I  Anesthesia Plan: General   Post-op Pain Management:    Induction: Intravenous  PONV Risk Score and Plan: 2 and Ondansetron and Dexamethasone  Airway Management Planned: Oral ETT  Additional Equipment:   Intra-op Plan:   Post-operative Plan:   Informed Consent: I have reviewed the patients History and Physical, chart, labs and discussed the procedure including the risks, benefits and alternatives for the proposed anesthesia with the patient or authorized representative who has indicated his/her understanding and acceptance.       Plan Discussed with:   Anesthesia Plan Comments:         Anesthesia Quick Evaluation  

## 2019-07-10 NOTE — Anesthesia Procedure Notes (Signed)
Procedure Name: Intubation Date/Time: 07/10/2019 7:42 AM Performed by: Lowry Bowl, CRNA Pre-anesthesia Checklist: Patient identified, Emergency Drugs available, Suction available and Patient being monitored Patient Re-evaluated:Patient Re-evaluated prior to induction Oxygen Delivery Method: Circle system utilized Preoxygenation: Pre-oxygenation with 100% oxygen Induction Type: IV induction Ventilation: Mask ventilation without difficulty Laryngoscope Size: Mac and 4 Grade View: Grade I Tube type: Oral Tube size: 7.5 mm Number of attempts: 1 Airway Equipment and Method: Stylet Placement Confirmation: ETT inserted through vocal cords under direct vision,  positive ETCO2 and breath sounds checked- equal and bilateral Secured at: 22 cm Tube secured with: Tape Dental Injury: Teeth and Oropharynx as per pre-operative assessment

## 2019-07-10 NOTE — Interval H&P Note (Signed)
History and Physical Interval Note:  07/10/2019 7:25 AM  Ronald Vega  has presented today for surgery, with the diagnosis of left inguinal hernia and umbilical hernia.  The various methods of treatment have been discussed with the patient and family. After consideration of risks, benefits and other options for treatment, the patient has consented to  Procedure(s): XI ROBOTIC [place] (Left) HERNIA REPAIR UMBILICAL ADULT (N/A) VASECTOMY (N/A) as a surgical intervention.  The patient's history has been reviewed, patient examined, no change in status, stable for surgery.  I have reviewed the patient's chart and labs.  Questions were answered to the patient's satisfaction.     [name]

## 2019-07-10 NOTE — Telephone Encounter (Signed)
Post op called to schedule a f/u appt for pt who had a vasectomy.  There isn't anything in chart about a f/u.

## 2019-07-10 NOTE — Op Note (Signed)
  Indications: The patient is a 33 y.o. male who presents today for elective sterilization.  He has been consented for the procedure.  He is aware of the risks and benefits.  He had no additional questions.  He agrees to proceed.  He denies any other significant change since his last visit.  He will also undergo robotic repair of inguinal and umbilical hernias and vasectomy is performed under general anesthesia.  Pre-operative Diagnosis: Elective sterilization  Post-operative Diagnosis: Elective sterilization  Surgeon: Nicki Reaper C. Zaine Elsass, M.D  Description:  He was transported to the operating room and placed supine on the table.  The patient was prepped and draped in the standard fashion.  Timeout was performed.   The right vas deferens was identified and brought superiorly to the anterior scrotal skin.  The skin and vas was then anesthetized utilizing 4 ml 1% lidocaine.  A small stab incision was made and spread with the vas dissector.  The vas was grasped utilizing the vas clamp and elevated out of the incision.  The vas was dissected free from surrounding tissue and vessels and an ~1 cm segment was excised.  The vas lumens were cauterized utilizing electrocautery.  The distal segment was buried in the surrounding sheath with a 3-0 chromic suture.  No significant bleeding was observed.  The vas ends were then dropped back into the hemiscrotum.  The skin was closed with hemostatic pressure.  An identical procedure was performed on the contralateral side.   He was then reprepped and draped for his general surgical procedure which will be dictated separately by Dr. Christian Mate.  Complications:None  Recommendations: 1.  Scrotal support for 1 week. 3.  May resume intercourse in one week if no significant discomfort.  Continue alternate contraception for 12 weeks.  5.  Call for significant pain, swelling, redness, drainage or fever greater than 100.5. 6.  Follow-up semen analysis 12 weeks.   John Giovanni, MD

## 2019-07-10 NOTE — Anesthesia Postprocedure Evaluation (Signed)
Anesthesia Post Note  Patient: Ronald Vega  Procedure(s) Performed: XI ROBOTIC ASSISTED INGUINAL HERNIA REPAIR WITH MESH (Left ) HERNIA REPAIR UMBILICAL ADULT (N/A ) VASECTOMY (N/A )  Patient location during evaluation: PACU Anesthesia Type: General Level of consciousness: awake and alert Pain management: pain level controlled Vital Signs Assessment: post-procedure vital signs reviewed and stable Respiratory status: spontaneous breathing and respiratory function stable Cardiovascular status: stable Anesthetic complications: no     Last Vitals:  Vitals:   07/10/19 1056 07/10/19 1105  BP: (!) 112/55 118/63  Pulse: 62 61  Resp: 14 18  Temp: 36.7 C 36.7 C  SpO2: 94% 98%    Last Pain:  Vitals:   07/10/19 1105  TempSrc: Temporal  PainSc: 5                  Keyari Kleeman K

## 2019-07-10 NOTE — Discharge Instructions (Addendum)
Laparoscopic Inguinal Hernia Repair, Adult, Care After This sheet gives you information about how to care for yourself after your procedure. Your health care provider may also give you more specific instructions. If you have problems or questions, contact your health care provider. What can I expect after the procedure? After the procedure, it is common to have:  Pain.  Swelling and bruising around the incision area.  Scrotal swelling, in men.  Some fluid or blood draining from your incisions. Follow these instructions at home: Incision care  Follow instructions from your health care provider about how to take care of your incisions. Make sure you: ? Wash your hands with soap and water before you change your bandage (dressing). If soap and water are not available, use hand sanitizer. ? Change your dressing as told by your health care provider. ? Leave stitches (sutures), skin glue, or adhesive strips in place. These skin closures may need to stay in place for 2 weeks or longer. If adhesive strip edges start to loosen and curl up, you may trim the loose edges. Do not remove adhesive strips completely unless your health care provider tells you to do that.  Check your incision area every day for signs of infection. Check for: ? More redness, swelling, or pain. ? More fluid or blood. ? Warmth. ? Pus or a bad smell.  Wear loose, soft clothing while your incisions heal. Driving  Do not drive or use heavy machinery while taking prescription pain medicine.  Do not drive for 24 hours if you were given a medicine to help you relax (sedative) during your procedure. Activity  Do not lift anything that is heavier than 10 lb (4.5 kg), or the limit that you are told, until your health care provider says that it is safe.  Ask your health care provider what activities are safe for you.A lot of activity during the first week after surgery can increase pain and swelling. For 1 week after your  procedure: ? Avoid activities that take a lot of effort, such as exercise or sports. ? You may walk and climb stairs as needed for daily activity, but avoid long walks or climbing stairs for exercise. Managing pain and swelling   Put ice on painful or swollen areas: ? Put ice in a plastic bag. ? Place a towel between your skin and the bag. ? Leave the ice on for 20 minutes, 2-3 times a day. General instructions  Do not take baths, swim, or use a hot tub until your health care provider approves. Ask your health care provider if you may take showers. You may only be allowed to take sponge baths.  Take over-the-counter and prescription medicines only as told by your health care provider.  To prevent or treat constipation while you are taking prescription pain medicine, your health care provider may recommend that you: ? Drink enough fluid to keep your urine pale yellow. ? Take over-the-counter or prescription medicines. ? Eat foods that are high in fiber, such as fresh fruits and vegetables, whole grains, and beans. ? Limit foods that are high in fat and processed sugars, such as fried and sweet foods.  Do not use any products that contain nicotine or tobacco, such as cigarettes and e-cigarettes. If you need help quitting, ask your health care provider.  Drink enough fluid to keep your urine pale yellow.  Keep all follow-up visits as told by your health care provider. This is important. Contact a health care provider if:  You have more redness, swelling, or pain around your incisions or your groin area.  You have more swelling in your scrotum.  You have more fluid or blood coming from your incisions.  Your incisions feel warm to the touch.  You have severe pain and medicines do not help.  You have abdominal pain or swelling.  You cannot eat or drink without vomiting.  You cannot urinate or pass a bowel movement.  You faint.  You feel dizzy.  You have nausea and  vomiting.  You have a fever. Get help right away if:  You have pus or a bad smell coming from your incisions.  You have chest pain.  You have problems breathing. Summary  Pain, swelling, and bruising are common after the procedure.  Check your incision area every day for signs of infection, such as more redness, swelling, or pain.  Put ice on painful or swollen areas for 20 minutes, 2-3 times a day. This information is not intended to replace advice given to you by your health care provider. Make sure you discuss any questions you have with your health care provider. Document Released: 10/28/2016 Document Revised: 07/22/2017 Document Reviewed: 10/28/2016 Elsevier Patient Education  2020 [employer]. Vasectomy, Care After This sheet gives you information about how to care for yourself after your procedure. Your health care provider may also give you more specific instructions. If you have problems or questions, contact your health care provider. What can I expect after the procedure? After your procedure, it is common to have:  Mild pain, swelling, redness, or discomfort in your scrotum.  Some blood coming from your incisions or puncture sites for one or two days.  Blood in your semen. Follow these instructions at home: Medicines   Take over-the-counter and prescription medicines only as told by your health care provider.  Avoid taking NSAIDs such as aspirin and ibuprofen, because these medicines can make bleeding worse. Activity  For the first 2 days after surgery, avoid physical activity and exercise that require a lot of energy. Ask your health care provider what activities are safe for you.  Do not participate in sports or perform heavy physical labor until your pain has improved, or until your health care provider says it is okay.  Do not ejaculate for at least 1 week after the procedure, or as long as directed.  You may resume sexual activity 7-10 days after your  procedure, or when your health care provider approves. Use a different method of birth control (contraception) until you have had test results that confirm that there is no sperm in your semen. Scrotal support  Use scrotal support, such as a jock strap or underwear with a supportive pouch, as needed for one week after your procedure.  If you feel discomfort in your scrotum, you may remove the scrotal support to see if the discomfort is relieved. Sometimes scrotal support can press on the scrotum and cause or worsen discomfort.  If your skin gets irritated, you may add some germ-free (sterile), fluffed bandages or a clean washcloth to the scrotal support. General instructions  Put ice on the injured area: ? Put ice in a plastic bag. ? Place a towel between your skin and the bag. ? Leave the ice on for 20 minutes, 2-3 times a day.  Check your incisions or puncture sites every day for signs of infection. Check for: ? Redness, swelling, or pain. ? Fluid or blood. ? Warmth. ? Pus or a bad smell.  Keep all follow-up visits as told by your health care provider. This is important because you will need a test to confirm that there is no sperm in your semen. Multiple ejaculations are needed to clear out sperm that were beyond the vasectomy site. You will need one test result showing that there is no sperm in your semen before you can resume unprotected sex. This may take 2-4 months after your procedure.  Do not drive for 24 hours if you were given a sedative to help you relax. Contact a health care provider if:  You have redness, swelling, or more pain around your incision or puncture site, or in your scrotum area in general.  You have bleeding from your incision or puncture site.  You have pus or a bad smell coming from your incision or puncture site.  You have a fever.  Your incision or puncture site opens up. Get help right away if:  You develop a rash.  You have difficulty  breathing. Summary  After your procedure it is common to have mild pain, swelling, redness, or discomfort in your scrotum.  Avoid physical activity and exercise that requires a lot of energy for the first 2 days after surgery.  Put ice on the injured area. Leave the ice on for 20 minutes, 2-3 times a day.  Do not drive for 24 hours if you were given a sedative to help you relax.  Any activity instructions regarding your hernia repairs will supersede these activity instructions.   This information is not intended to replace advice given to you by your health care provider. Make sure you discuss any questions you have with your health care provider. Document Released: 02/05/2005 Document Revised: 07/01/2017 Document Reviewed: 10/15/2016 Elsevier Patient Education  2020 [employer]   AMBULATORY SURGERY  DISCHARGE INSTRUCTIONS   1) The drugs that you were given will stay in your system until tomorrow so for the next 24 hours you should not:  A) Drive an automobile B) Make any legal decisions C) Drink any alcoholic beverage   2) You may resume regular meals tomorrow.  Today it is better to start with liquids and gradually work up to solid foods.  You may eat anything you prefer, but it is better to start with liquids, then soup and crackers, and gradually work up to solid foods.   3) Please notify your doctor immediately if you have any unusual bleeding, trouble breathing, redness and pain at the surgery site, drainage, fever, or pain not relieved by medication.    4) Additional Instructions:        Please contact your physician with any problems or Same Day Surgery at [phone], Monday through Friday 6 am to 4 pm, or [place] [facility] Main number at [phone].

## 2019-07-19 ENCOUNTER — Encounter: Payer: Self-pay | Admitting: Surgery

## 2019-07-19 ENCOUNTER — Ambulatory Visit (INDEPENDENT_AMBULATORY_CARE_PROVIDER_SITE_OTHER): Payer: Self-pay | Admitting: Surgery

## 2019-07-19 ENCOUNTER — Other Ambulatory Visit: Payer: Self-pay

## 2019-07-19 DIAGNOSIS — Z9889 Other specified postprocedural states: Secondary | ICD-10-CM

## 2019-07-19 DIAGNOSIS — Z8719 Personal history of other diseases of the digestive system: Secondary | ICD-10-CM

## 2019-07-19 NOTE — Patient Instructions (Addendum)
Patient may gradually ease back into activity to what his body can withstand.  Patient is to refrain from any heavy lifting of more than 10 lbs for four more weeks.   Laparoscopic Inguinal Hernia Repair, Adult, Care After This sheet gives you information about how to care for yourself after your procedure. Your health care provider may also give you more specific instructions. If you have problems or questions, contact your health care provider. What can I expect after the procedure? After the procedure, it is common to have:  Pain.  Swelling and bruising around the incision area.  Scrotal swelling, in men.  Some fluid or blood draining from your incisions. Follow these instructions at home: Incision care  Follow instructions from your health care provider about how to take care of your incisions. Make sure you: ? Wash your hands with soap and water before you change your bandage (dressing). If soap and water are not available, use hand sanitizer. ? Change your dressing as told by your health care provider. ? Leave stitches (sutures), skin glue, or adhesive strips in place. These skin closures may need to stay in place for 2 weeks or longer. If adhesive strip edges start to loosen and curl up, you may trim the loose edges. Do not remove adhesive strips completely unless your health care provider tells you to do that.  Check your incision area every day for signs of infection. Check for: ? More redness, swelling, or pain. ? More fluid or blood. ? Warmth. ? Pus or a bad smell.  Wear loose, soft clothing while your incisions heal. Driving  Do not drive or use heavy machinery while taking prescription pain medicine.  Do not drive for 24 hours if you were given a medicine to help you relax (sedative) during your procedure. Activity  Do not lift anything that is heavier than 10 lb (4.5 kg), or the limit that you are told, until your health care provider says that it is safe.  Ask your  health care provider what activities are safe for you.A lot of activity during the first week after surgery can increase pain and swelling. For 1 week after your procedure: ? Avoid activities that take a lot of effort, such as exercise or sports. ? You may walk and climb stairs as needed for daily activity, but avoid long walks or climbing stairs for exercise. Managing pain and swelling   Put ice on painful or swollen areas: ? Put ice in a plastic bag. ? Place a towel between your skin and the bag. ? Leave the ice on for 20 minutes, 2-3 times a day. General instructions  Do not take baths, swim, or use a hot tub until your health care provider approves. Ask your health care provider if you may take showers. You may only be allowed to take sponge baths.  Take over-the-counter and prescription medicines only as told by your health care provider.  To prevent or treat constipation while you are taking prescription pain medicine, your health care provider may recommend that you: ? Drink enough fluid to keep your urine pale yellow. ? Take over-the-counter or prescription medicines. ? Eat foods that are high in fiber, such as fresh fruits and vegetables, whole grains, and beans. ? Limit foods that are high in fat and processed sugars, such as fried and sweet foods.  Do not use any products that contain nicotine or tobacco, such as cigarettes and e-cigarettes. If you need help quitting, ask your health care provider.  Drink enough fluid to keep your urine pale yellow.  Keep all follow-up visits as told by your health care provider. This is important. Contact a health care provider if:  You have more redness, swelling, or pain around your incisions or your groin area.  You have more swelling in your scrotum.  You have more fluid or blood coming from your incisions.  Your incisions feel warm to the touch.  You have severe pain and medicines do not help.  You have abdominal pain or  swelling.  You cannot eat or drink without vomiting.  You cannot urinate or pass a bowel movement.  You faint.  You feel dizzy.  You have nausea and vomiting.  You have a fever. Get help right away if:  You have pus or a bad smell coming from your incisions.  You have chest pain.  You have problems breathing. Summary  Pain, swelling, and bruising are common after the procedure.  Check your incision area every day for signs of infection, such as more redness, swelling, or pain.  Put ice on painful or swollen areas for 20 minutes, 2-3 times a day. This information is not intended to replace advice given to you by your health care provider. Make sure you discuss any questions you have with your health care provider. Document Released: 10/28/2016 Document Revised: 07/22/2017 Document Reviewed: 10/28/2016 Elsevier Patient Education  2020 [employer].

## 2019-07-19 NOTE — Progress Notes (Signed)
[facility] POST-OP OFFICE VISIT  07/19/2019  HPI: AIRAM HEIDECKER is a 33 y.o. male 9 days s/p robotic assisted laparoscopic left inguinal hernia repair and vasectomy.  He reports excellent progress with pain control, reports memorable coughs and sneezes.  But otherwise progressing well, increasing activity, enjoying playing with children etc.  Vital signs: BP (!) 152/95   Pulse 84   Temp 98.1 F (36.7 C) (Temporal)   Ht 5\' 9"  (1.753 m)   Wt 174 lb (78.9 kg)   SpO2 96%   BMI 25.70 kg/m    Physical Exam: Constitutional: Looks great, reports feeling well. Abdomen: Nondistended, nontender.  Upper abdominal incisions clean dry and intact with Dermabond in place.  Small degree of swelling/pseudohernia of left groin, minimally tender no evidence of ecchymosis.   Assessment/Plan: This is a 33 y.o. male 9 days s/p robotic left inguinal hernia repair.   -Advised to resume activity with caution, essentially unrestricted.  Follow-up as needed.   [name]

## 2019-10-21 ENCOUNTER — Other Ambulatory Visit: Payer: Self-pay

## 2019-10-21 ENCOUNTER — Emergency Department
Admission: EM | Admit: 2019-10-21 | Discharge: 2019-10-21 | Disposition: A | Discharge status: Home / Self Care | Payer: Self-pay | Attending: Emergency Medicine | Admitting: Emergency Medicine

## 2019-10-21 DIAGNOSIS — R4589 Other symptoms and signs involving emotional state: Secondary | ICD-10-CM

## 2019-10-21 DIAGNOSIS — F10929 Alcohol use, unspecified with intoxication, unspecified: Secondary | ICD-10-CM | POA: Insufficient documentation

## 2019-10-21 DIAGNOSIS — R45851 Suicidal ideations: Secondary | ICD-10-CM | POA: Insufficient documentation

## 2019-10-21 DIAGNOSIS — Z79899 Other long term (current) drug therapy: Secondary | ICD-10-CM | POA: Insufficient documentation

## 2019-10-21 DIAGNOSIS — Z789 Other specified health status: Secondary | ICD-10-CM

## 2019-10-21 DIAGNOSIS — F329 Major depressive disorder, single episode, unspecified: Secondary | ICD-10-CM | POA: Insufficient documentation

## 2019-10-21 DIAGNOSIS — Y903 Blood alcohol level of 60-79 mg/100 ml: Secondary | ICD-10-CM | POA: Insufficient documentation

## 2019-10-21 DIAGNOSIS — F430 Acute stress reaction: Secondary | ICD-10-CM | POA: Insufficient documentation

## 2019-10-21 DIAGNOSIS — F1094 Alcohol use, unspecified with alcohol-induced mood disorder: Secondary | ICD-10-CM | POA: Diagnosis present

## 2019-10-21 LAB — CBC
HCT: 42.7 % (ref 39.0–52.0)
Hemoglobin: 15.1 g/dL (ref 13.0–17.0)
MCH: 30.4 pg (ref 26.0–34.0)
MCHC: 35.4 g/dL (ref 30.0–36.0)
MCV: 86.1 fL (ref 80.0–100.0)
Platelets: 241 10*3/uL (ref 150–400)
RBC: 4.96 MIL/uL (ref 4.22–5.81)
RDW: 11.9 % (ref 11.5–15.5)
WBC: 8.7 10*3/uL (ref 4.0–10.5)
nRBC: 0 % (ref 0.0–0.2)

## 2019-10-21 LAB — COMPREHENSIVE METABOLIC PANEL
ALT: 48 U/L — ABNORMAL HIGH (ref 0–44)
AST: 59 U/L — ABNORMAL HIGH (ref 15–41)
Albumin: 4.7 g/dL (ref 3.5–5.0)
Alkaline Phosphatase: 45 U/L (ref 38–126)
Anion gap: 9 (ref 5–15)
BUN: 23 mg/dL — ABNORMAL HIGH (ref 6–20)
CO2: 28 mmol/L (ref 22–32)
Calcium: 9.6 mg/dL (ref 8.9–10.3)
Chloride: 101 mmol/L (ref 98–111)
Creatinine, Ser: 1.06 mg/dL (ref 0.61–1.24)
GFR calc Af Amer: >60 mL/min (ref >60)
GFR calc non Af Amer: >60 mL/min (ref >60)
Glucose, Bld: 98 mg/dL (ref 70–99)
Potassium: 3.7 mmol/L (ref 3.5–5.1)
Sodium: 138 mmol/L (ref 135–145)
Total Bilirubin: 0.7 mg/dL (ref 0.3–1.2)
Total Protein: 7.9 g/dL (ref 6.5–8.1)

## 2019-10-21 LAB — URINE DRUG SCREEN, QUALITATIVE (ARMC ONLY)
Amphetamines, Ur Screen: NOT DETECTED
Barbiturates, Ur Screen: NOT DETECTED
Benzodiazepine, Ur Scrn: NOT DETECTED
Cannabinoid 50 Ng, Ur ~~LOC~~: NOT DETECTED
Cocaine Metabolite,Ur ~~LOC~~: NOT DETECTED
MDMA (Ecstasy)Ur Screen: NOT DETECTED
Methadone Scn, Ur: NOT DETECTED
Opiate, Ur Screen: NOT DETECTED
Phencyclidine (PCP) Ur S: NOT DETECTED
Tricyclic, Ur Screen: NOT DETECTED

## 2019-10-21 LAB — SALICYLATE LEVEL: Salicylate Lvl: <7 mg/dL — ABNORMAL LOW (ref 7.0–30.0)

## 2019-10-21 LAB — ACETAMINOPHEN LEVEL: Acetaminophen (Tylenol), Serum: <10 ug/mL — ABNORMAL LOW (ref 10–30)

## 2019-10-21 LAB — ETHANOL: Alcohol, Ethyl (B): 71 mg/dL — ABNORMAL HIGH (ref <10)

## 2019-10-21 NOTE — ED Notes (Signed)
Meal tray provided.

## 2019-10-21 NOTE — Consult Note (Signed)
[facility] Face-to-Face Psychiatry Consult   Reason for Consult:  Psych evaluation  Referring Physician:  Dr. Manson Passey Patient Identification: Ronald Vega MRN:  [id number] Principal Diagnosis: Suicidal behavior Diagnosis:  Principal Problem:   Suicidal behavior   Total Time spent with patient: 45 minutes  Subjective:   Ronald Vega is a 34 y.o. male per er-nurse Pt to the ER for IVC r/t a suicidal threat. Pt wife called 911 and stated that pt tonight held a gun to his head and stated "I might as well blow my brains out." Pt is calm and cooperative. Pt denies any SI. Pt states he was arguing with wife and made the statement. Pt denies any intent on committing suicide. Pt is appropriate.  HPI:  Ronald Vega, 34 y.o., male patient seen via telepsych by this provider; chart reviewed and consulted with Dr. Lucianne Muss on 10/21/19.  On evaluation ADRIANA LINA reports that he got into an argument with his wife. However, during assessment pt became agitated when asked to give detail about his encounter tonight "I don't remember it's 4am." Patient did admit that he had a loaded gun and made the gesture to want to hurt himself. Patient was very vague with details regarding what lead up the situation "I don't know she got mad and left and I was trying to find her, we got into a heated argument." Patient denies current SI/HI/AH/VH. Patient BAL is 71, no other substances detected.   Recommendation: reassess in the am.  Patient has access to a loaded gun and is still currently agitated.    Past Psychiatric History: denies  Risk to Self: Suicidal Ideation: No-Not Currently/Within Last 6 Months Suicidal Intent: No-Not Currently/Within Last 6 Months Is patient at risk for suicide?: No Suicidal Plan?: No-Not Currently/Within Last 6 Months Access to Means: Yes Specify Access to Suicidal Means: Patient has access to a loaded gun What has been your use of drugs/alcohol within the last 12 months?:  Alcohol use How many times?: 0 Triggers for Past Attempts: None known Intentional Self Injurious Behavior: None Risk to Others: Homicidal Ideation: No Thoughts of Harm to Others: No Current Homicidal Intent: No Current Homicidal Plan: No Access to Homicidal Means: No History of harm to others?: No Assessment of Violence: None Noted Does patient have access to weapons?: No Criminal Charges Pending?: No Does patient have a court date: No Prior Inpatient Therapy: Prior Inpatient Therapy: No Prior Outpatient Therapy: Prior Outpatient Therapy: No Does patient have an ACCT team?: No Does patient have Intensive In-House Services?  : No Does patient have [facility] services? : No Does patient have P4CC services?: No  Past Medical History:  Past Medical History:  Diagnosis Date  . Status post laparoscopic hernia repair 07/10/2019    Past Surgical History:  Procedure Laterality Date  . KNEE SURGERY Right 2008   arthroscopy and repair of ham string, acl   screws  . left leg surgery  2005,2006   rod placed and then removed  . UMBILICAL HERNIA REPAIR N/A 07/10/2019   Procedure: HERNIA REPAIR UMBILICAL ADULT;  Surgeon: Campbell Lerner, MD;  Location: [facility] ORS;  Service: General;  Laterality: N/A;  . VASECTOMY N/A 07/10/2019   Procedure: VASECTOMY;  Surgeon: Riki Altes, MD;  Location: [facility] ORS;  Service: Urology;  Laterality: N/A;  . XI ROBOTIC ASSISTED INGUINAL HERNIA REPAIR WITH MESH Left 07/10/2019   Procedure: XI ROBOTIC ASSISTED INGUINAL HERNIA REPAIR WITH MESH;  Surgeon: Campbell Lerner, MD;  Location: [facility] ORS;  Service: General;  Laterality: Left;   Family History: No family history on file. Family Psychiatric  History: unknown Social History:  Social History   Substance and Sexual Activity  Alcohol Use Yes   Comment: rare     Social History   Substance and Sexual Activity  Drug Use No    Social History   Socioeconomic History  . Marital status: Married    Spouse  name: Carlie  . Number of children: 4  . Years of education: Not on file  . Highest education level: Not on file  Occupational History  . Not on file  Tobacco Use  . Smoking status: Never Smoker  . Smokeless tobacco: Never Used  Substance and Sexual Activity  . Alcohol use: Yes    Comment: rare  . Drug use: No  . Sexual activity: Yes  Other Topics Concern  . Not on file  Social History Narrative  . Not on file   Social Determinants of Health   Financial Resource Strain:   . Difficulty of Paying Living Expenses:   Food Insecurity:   . Worried About Programme researcher, broadcasting/film/video in the Last Year:   . Barista in the Last Year:   Transportation Needs:   . Freight forwarder (Medical):   Marland Kitchen Lack of Transportation (Non-Medical):   Physical Activity:   . Days of Exercise per Week:   . Minutes of Exercise per Session:   Stress:   . Feeling of Stress :   Social Connections:   . Frequency of Communication with Friends and Family:   . Frequency of Social Gatherings with Friends and Family:   . Attends Religious Services:   . Active Member of Clubs or Organizations:   . Attends Banker Meetings:   Marland Kitchen Marital Status:    Additional Social History:    Allergies:  No Known Allergies  Labs:  Results for orders placed or performed during the hospital encounter of 10/21/19 (from the past 48 hour(s))  Comprehensive metabolic panel     Status: Abnormal   Collection Time: 10/21/19  2:32 AM  Result Value Ref Range   Sodium 138 135 - 145 mmol/L   Potassium 3.7 3.5 - 5.1 mmol/L   Chloride 101 98 - 111 mmol/L   CO2 28 22 - 32 mmol/L   Glucose, Bld 98 70 - 99 mg/dL    Comment: Glucose reference range applies only to samples taken after fasting for at least 8 hours.   BUN 23 (H) 6 - 20 mg/dL   Creatinine, Ser 1.15 0.61 - 1.24 mg/dL   Calcium 9.6 8.9 - 72.6 mg/dL   Total Protein 7.9 6.5 - 8.1 g/dL   Albumin 4.7 3.5 - 5.0 g/dL   AST 59 (H) 15 - 41 U/L   ALT 48 (H) 0 -  44 U/L   Alkaline Phosphatase 45 38 - 126 U/L   Total Bilirubin 0.7 0.3 - 1.2 mg/dL   GFR calc non Af Amer >60 >60 mL/min   GFR calc Af Amer >60 >60 mL/min   Anion gap 9 5 - 15    Comment: Performed at [facility], [address] [place], [place] [address]  Ethanol     Status: Abnormal   Collection Time: 10/21/19  2:32 AM  Result Value Ref Range   Alcohol, Ethyl (B) 71 (H) <10 mg/dL    Comment: (NOTE) Lowest detectable limit for serum alcohol is 10 mg/dL. For medical purposes only. Performed  at [facility], [address] [place], [place] [address]   Salicylate level     Status: Abnormal   Collection Time: 10/21/19  2:32 AM  Result Value Ref Range   Salicylate Lvl <7.0 (L) 7.0 - 30.0 mg/dL    Comment: Performed at [facility], [address] [place], [place] [address]  Acetaminophen level     Status: Abnormal   Collection Time: 10/21/19  2:32 AM  Result Value Ref Range   Acetaminophen (Tylenol), Serum <10 (L) 10 - 30 ug/mL    Comment: (NOTE) Therapeutic concentrations vary significantly. A range of 10-30 ug/mL  may be an effective concentration for many patients. However, some  are best treated at concentrations outside of this range. Acetaminophen concentrations >150 ug/mL at 4 hours after ingestion  and >50 ug/mL at 12 hours after ingestion are often associated with  toxic reactions. Performed at [facility], [address] [place], [place] [address]   cbc     Status: None   Collection Time: 10/21/19  2:32 AM  Result Value Ref Range   WBC 8.7 4.0 - 10.5 K/uL   RBC 4.96 4.22 - 5.81 MIL/uL   Hemoglobin 15.1 13.0 - 17.0 g/dL   HCT 41.7 40.8 - 14.4 %   MCV 86.1 80.0 - 100.0 fL   MCH 30.4 26.0 - 34.0 pg   MCHC 35.4 30.0 - 36.0 g/dL   RDW 81.8 56.3 - 14.9 %   Platelets 241 150 - 400 K/uL   nRBC 0.0 0.0 - 0.2 %    Comment: Performed at [facility], [address] [place], [place] [address]  Urine Drug Screen,  Qualitative     Status: None   Collection Time: 10/21/19  2:32 AM  Result Value Ref Range   Tricyclic, Ur Screen NONE DETECTED NONE DETECTED   Amphetamines, Ur Screen NONE DETECTED NONE DETECTED   MDMA (Ecstasy)Ur Screen NONE DETECTED NONE DETECTED   Cocaine Metabolite,Ur [place] NONE DETECTED NONE DETECTED   Opiate, Ur Screen NONE DETECTED NONE DETECTED   Phencyclidine (PCP) Ur S NONE DETECTED NONE DETECTED   Cannabinoid 50 Ng, Ur [place] NONE DETECTED NONE DETECTED   Barbiturates, Ur Screen NONE DETECTED NONE DETECTED   Benzodiazepine, Ur Scrn NONE DETECTED NONE DETECTED   Methadone Scn, Ur NONE DETECTED NONE DETECTED    Comment: (NOTE) Tricyclics + metabolites, urine    Cutoff 1000 ng/mL Amphetamines + metabolites, urine  Cutoff 1000 ng/mL MDMA (Ecstasy), urine              Cutoff 500 ng/mL Cocaine Metabolite, urine          Cutoff 300 ng/mL Opiate + metabolites, urine        Cutoff 300 ng/mL Phencyclidine (PCP), urine         Cutoff 25 ng/mL Cannabinoid, urine                 Cutoff 50 ng/mL Barbiturates + metabolites, urine  Cutoff 200 ng/mL Benzodiazepine, urine              Cutoff 200 ng/mL Methadone, urine                   Cutoff 300 ng/mL The urine drug screen provides only a preliminary, unconfirmed analytical test result and should not be used for non-medical purposes. Clinical consideration and professional judgment should be applied to any positive drug screen result due to possible interfering substances. A more specific alternate chemical  method must be used in order to obtain a confirmed analytical result. Gas chromatography / mass spectrometry (GC/MS) is the preferred confirmat ory method. Performed at [facility], [address] [place], [place] [address]     No current facility-administered medications for this encounter.   Current Outpatient Medications  Medication Sig Dispense Refill  . ibuprofen (ADVIL) 800 MG tablet Take 1 tablet (800 mg total) by  mouth every 8 (eight) hours as needed. 30 tablet 0  . Multiple Vitamin (MULTIVITAMIN WITH MINERALS) TABS tablet Take 1 tablet by mouth daily.      Musculoskeletal: Strength & Muscle Tone: within normal limits Gait & Station: normal Patient leans: N/A  Psychiatric Specialty Exam: Physical Exam  Nursing note and vitals reviewed. Constitutional: He is oriented to person, place, and time. He appears well-developed.  Eyes: Pupils are equal, round, and reactive to light.  Respiratory: Effort normal.  Musculoskeletal:        General: Normal range of motion.     Cervical back: Normal range of motion.  Neurological: He is alert and oriented to person, place, and time.  Skin: Skin is warm and dry.  Psychiatric: His affect is angry and labile. His speech is rapid and/or pressured. He is agitated and aggressive. He expresses impulsivity.    Review of Systems  Psychiatric/Behavioral: Positive for agitation. The patient is nervous/anxious.   All other systems reviewed and are negative.   Blood pressure (!) 153/85, pulse 84, temperature 98.3 F (36.8 C), resp. rate 18, height 5\' 9"  (1.753 m), weight 79.4 kg, SpO2 98 %.Body mass index is 25.84 kg/m.  General Appearance: Guarded  Eye Contact:  Fair  Speech:  Pressured  Volume:  Normal  Mood:  Angry  Affect:  Congruent  Thought Process:  Coherent and Descriptions of Associations: Circumstantial  Orientation:  Full (Time, Place, and Person)  Thought Content:  WDL  Suicidal Thoughts:  Yes.  without intent/plan  Homicidal Thoughts:  No  Memory:  Immediate;   Fair  Judgement:  Fair  Insight:  Lacking  Psychomotor Activity:  Normal  Concentration:  Concentration: Fair  Recall:  Angie of Knowledge:  Good  Language:  Good  Akathisia:  NA  Handed:  Right  AIMS (if indicated):     Assets:  Social Support  ADL's:  Intact  Cognition:  WNL  Sleep:        Treatment Plan Summary: reassess in the am  Disposition: Supportive therapy  provided about ongoing stressors. Discussed crisis plan, support from social network, calling 911, coming to the Emergency Department, and calling Suicide Hotline.  Deloria Lair, NP 10/21/2019 4:53 AM

## 2019-10-21 NOTE — ED Provider Notes (Signed)
Emergency Medicine Observation Re-evaluation Note  Ronald Vega is a 34 y.o. male, seen on rounds today.  Pt initially presented to the ED for complaints of ivc Currently, the patient is resting.  Physical Exam  BP (!) 153/85 (BP Location: Left Arm)   Pulse 84   Temp 98.3 F (36.8 C)   Resp 18   Ht 5\' 9"  (1.753 m)   Wt 79.4 kg   SpO2 98%   BMI 25.84 kg/m  Physical Exam  ED Course / MDM  EKG:    I have reviewed the labs performed to date as well as medications administered while in observation.  Recent changes in the last 24 hours include awaiting final psych disposition recommendations. Plan  Current plan is for awaiting psychiatry consult final recommendation. Patient is under full IVC at this time.   , MD 10/21/19 [phone]

## 2019-10-21 NOTE — Consult Note (Signed)
[facility] Face-to-Face Psychiatry Consult   Reason for Consult:  Suicidal ideation Referring Physician:  EDP Patient Identification: Ronald Vega MRN:  [id number] Principal Diagnosis: Alcohol-induced mood disorder (HCC) Diagnosis:  Principal Problem:   Alcohol-induced mood disorder (HCC) Active Problems:   Suicidal behavior   Total Time spent with patient: 45 minutes  Subjective:   Ronald Vega is a 34 y.o. male patient reports that him and his wife went out last night and they had an argument while they were going out.  Both admit been drinking alcohol in the argument got heated.  He stated that they drove home and the argument continued.  He states that he did grab a gun and was making threats about killing himself but had no intentions of doing it.  He stated that he was only doing it to get her attention.  Patient continues to state that he is extremely sorry for his behavior and that there was never any true intention of wanting to kill himself and understands that this was a very poor decision to make.  He denies having any suicidal homicidal ideations and denies any hallucinations.  Patient states he has never had any type of psychiatric history.  Patient reports that he is okay if the guns were removed from the house and that he feels that his father, [name], will take him to his house for safekeeping.  Patient also reports that him and his wife have been going to marriage counseling because they have been having some issues but nothing is ever gotten this escalated in the past.  Contacted patient's father [name], for collateral information.  He confirms the patient's story from what he has been told he had an argument and he grabbed a gun.  He states that he does not have any safety concerns with the patient be discharged and that the patient has never had any type of mental health history in the past.  He is in agreement to go to the house and pick the firearms up and  bring him back to his.  Patient's wife, [name] was contacted as well.  She confirms the story that they were having an argument and when they got home the argument escalated.  She states that the patient did grab a gun and that she was crying and arguing with him still.  She stated that she was trying to get out of the house.  She states that he had laid the gun down and they were continuing to argue and she finally got out of the house and ran down the street.  She stated that she went to her mother's house and when the patient arrived they are continuing to argue with her without the gun, her mother called the police and the patient was brought into the hospital under IVC for threatening with a gun.  She states that she does not feel that the patient was truly trying to kill himself and was only doing it for attention.  She states that he does get angry sometimes but this is never happened in the past and he has no mental health history.  She also is in agreement with the patient coming back home and also in agreement with the patient's father coming to the house to pick up the firearms.  HPI: Per EDP:33 y.o. male with below list of previous medical conditions presents to the emergency department secondary to involuntary commitment.  Per commitment the patient placed a gun to his head  and stated "I might as well blow my brains out".  Patient states that this occurred while having a verbal altercation with his wife.  He does admit to making the statement.  Patient however denies any suicidal ideation at present.  Patient does admit to EtOH ingestion earlier in the evening.  Patient is seen by this provider via face-to-face and have consulted with Dr. Mallie Darting.  Patient has continued to deny any suicidal or homicidal ideations and denies any hallucinations.  Patient is pleasant, calm, and cooperative during the evaluation and extremely apologetic for even having to be at the hospital.  Patient does not  appear to be responding to any internal or external stimuli and has not presented with any aggressive or threatening behavior.  Patient did make a threat with a gun but has been confirmed by the patient, his wife, and his father that the patient has never attempted suicide and is never had any psychiatric history.  The guns will be removed from the house by the patient's father and locked away.  It is been confirmed that the patient does have a marriage counseling appointment tomorrow at 2 PM.  Patient's wife feels that he is safe to discharge home and that he is welcome to come back to the house.  Was also confirmed the patient does not appear to have a alcohol abuse issue and that this is only occasionally that he drinks.  I have rescinded the patient's IVC.  I have notified Dr. Jacqualine Code of the recommendations.  At this time the patient does not meet any inpatient psychiatric treatment criteria and is psychiatrically cleared.  Past Psychiatric History: No documented or reported psychiatric history.  No previous suicide attempts reported  Risk to Self: Suicidal Ideation: No-Not Currently/Within Last 6 Months Suicidal Intent: No-Not Currently/Within Last 6 Months Is patient at risk for suicide?: No Suicidal Plan?: No-Not Currently/Within Last 6 Months Access to Means: Yes Specify Access to Suicidal Means: Patient has access to a loaded gun What has been your use of drugs/alcohol within the last 12 months?: Alcohol use How many times?: 0 Triggers for Past Attempts: None known Intentional Self Injurious Behavior: None Risk to Others: Homicidal Ideation: No Thoughts of Harm to Others: No Current Homicidal Intent: No Current Homicidal Plan: No Access to Homicidal Means: No History of harm to others?: No Assessment of Violence: None Noted Does patient have access to weapons?: No Criminal Charges Pending?: No Does patient have a court date: No Prior Inpatient Therapy: Prior Inpatient Therapy:  No Prior Outpatient Therapy: Prior Outpatient Therapy: No Does patient have an ACCT team?: No Does patient have Intensive In-House Services?  : No Does patient have [facility] services? : No Does patient have P4CC services?: No  Past Medical History:  Past Medical History:  Diagnosis Date  . Status post laparoscopic hernia repair 07/10/2019    Past Surgical History:  Procedure Laterality Date  . KNEE SURGERY Right 2008   arthroscopy and repair of ham string, acl   screws  . left leg surgery  2005,2006   rod placed and then removed  . UMBILICAL HERNIA REPAIR N/A 07/10/2019   Procedure: HERNIA REPAIR UMBILICAL ADULT;  Surgeon: [name], MD;  Location: [facility] ORS;  Service: General;  Laterality: N/A;  . VASECTOMY N/A 07/10/2019   Procedure: VASECTOMY;  Surgeon: Abbie Sons, MD;  Location: [facility] ORS;  Service: Urology;  Laterality: N/A;  . XI ROBOTIC ASSISTED INGUINAL HERNIA REPAIR WITH MESH Left 07/10/2019   Procedure: XI ROBOTIC  ASSISTED INGUINAL HERNIA REPAIR WITH MESH;  Surgeon: Campbell Lernerodenberg, Denny, MD;  Location: [facility] ORS;  Service: General;  Laterality: Left;   Family History: No family history on file. Family Psychiatric  History: None reported Social History:  Social History   Substance and Sexual Activity  Alcohol Use Yes   Comment: rare     Social History   Substance and Sexual Activity  Drug Use No    Social History   Socioeconomic History  . Marital status: Married    Spouse name: Carlie  . Number of children: 4  . Years of education: Not on file  . Highest education level: Not on file  Occupational History  . Not on file  Tobacco Use  . Smoking status: Never Smoker  . Smokeless tobacco: Never Used  Substance and Sexual Activity  . Alcohol use: Yes    Comment: rare  . Drug use: No  . Sexual activity: Yes  Other Topics Concern  . Not on file  Social History Narrative  . Not on file   Social Determinants of Health   Financial Resource Strain:   .  Difficulty of Paying Living Expenses:   Food Insecurity:   . Worried About Programme researcher, broadcasting/film/videounning Out of Food in the Last Year:   . Baristaan Out of Food in the Last Year:   Transportation Needs:   . Freight forwarderLack of Transportation (Medical):   Marland Kitchen. Lack of Transportation (Non-Medical):   Physical Activity:   . Days of Exercise per Week:   . Minutes of Exercise per Session:   Stress:   . Feeling of Stress :   Social Connections:   . Frequency of Communication with Friends and Family:   . Frequency of Social Gatherings with Friends and Family:   . Attends Religious Services:   . Active Member of Clubs or Organizations:   . Attends BankerClub or Organization Meetings:   Marland Kitchen. Marital Status:    Additional Social History:    Allergies:  No Known Allergies  Labs:  Results for orders placed or performed during the hospital encounter of 10/21/19 (from the past 48 hour(s))  Comprehensive metabolic panel     Status: Abnormal   Collection Time: 10/21/19  2:32 AM  Result Value Ref Range   Sodium 138 135 - 145 mmol/L   Potassium 3.7 3.5 - 5.1 mmol/L   Chloride 101 98 - 111 mmol/L   CO2 28 22 - 32 mmol/L   Glucose, Bld 98 70 - 99 mg/dL    Comment: Glucose reference range applies only to samples taken after fasting for at least 8 hours.   BUN 23 (H) 6 - 20 mg/dL   Creatinine, Ser 1.611.06 0.61 - 1.24 mg/dL   Calcium 9.6 8.9 - 09.610.3 mg/dL   Total Protein 7.9 6.5 - 8.1 g/dL   Albumin 4.7 3.5 - 5.0 g/dL   AST 59 (H) 15 - 41 U/L   ALT 48 (H) 0 - 44 U/L   Alkaline Phosphatase 45 38 - 126 U/L   Total Bilirubin 0.7 0.3 - 1.2 mg/dL   GFR calc non Af Amer >60 >60 mL/min   GFR calc Af Amer >60 >60 mL/min   Anion gap 9 5 - 15    Comment: Performed at [facility], [address] [place], [place] [id number]  Ethanol     Status: Abnormal   Collection Time: 10/21/19  2:32 AM  Result Value Ref Range   Alcohol, Ethyl (B) 71 (H) <10 mg/dL  Comment: (NOTE) Lowest detectable limit for serum alcohol is 10 mg/dL. For medical  purposes only. Performed at [facility], [address] [place], [place] [address]   Salicylate level     Status: Abnormal   Collection Time: 10/21/19  2:32 AM  Result Value Ref Range   Salicylate Lvl <7.0 (L) 7.0 - 30.0 mg/dL    Comment: Performed at [facility], [address] [place], [place] [address]  Acetaminophen level     Status: Abnormal   Collection Time: 10/21/19  2:32 AM  Result Value Ref Range   Acetaminophen (Tylenol), Serum <10 (L) 10 - 30 ug/mL    Comment: (NOTE) Therapeutic concentrations vary significantly. A range of 10-30 ug/mL  may be an effective concentration for many patients. However, some  are best treated at concentrations outside of this range. Acetaminophen concentrations >150 ug/mL at 4 hours after ingestion  and >50 ug/mL at 12 hours after ingestion are often associated with  toxic reactions. Performed at [facility], [address] [place], [place] [address]   cbc     Status: None   Collection Time: 10/21/19  2:32 AM  Result Value Ref Range   WBC 8.7 4.0 - 10.5 K/uL   RBC 4.96 4.22 - 5.81 MIL/uL   Hemoglobin 15.1 13.0 - 17.0 g/dL   HCT 59.1 63.8 - 46.6 %   MCV 86.1 80.0 - 100.0 fL   MCH 30.4 26.0 - 34.0 pg   MCHC 35.4 30.0 - 36.0 g/dL   RDW 59.9 35.7 - 01.7 %   Platelets 241 150 - 400 K/uL   nRBC 0.0 0.0 - 0.2 %    Comment: Performed at [facility], [address] [place], [place] [address]  Urine Drug Screen, Qualitative     Status: None   Collection Time: 10/21/19  2:32 AM  Result Value Ref Range   Tricyclic, Ur Screen NONE DETECTED NONE DETECTED   Amphetamines, Ur Screen NONE DETECTED NONE DETECTED   MDMA (Ecstasy)Ur Screen NONE DETECTED NONE DETECTED   Cocaine Metabolite,Ur [place] NONE DETECTED NONE DETECTED   Opiate, Ur Screen NONE DETECTED NONE DETECTED   Phencyclidine (PCP) Ur S NONE DETECTED NONE DETECTED   Cannabinoid 50 Ng, Ur [place] NONE DETECTED NONE DETECTED   Barbiturates, Ur Screen  NONE DETECTED NONE DETECTED   Benzodiazepine, Ur Scrn NONE DETECTED NONE DETECTED   Methadone Scn, Ur NONE DETECTED NONE DETECTED    Comment: (NOTE) Tricyclics + metabolites, urine    Cutoff 1000 ng/mL Amphetamines + metabolites, urine  Cutoff 1000 ng/mL MDMA (Ecstasy), urine              Cutoff 500 ng/mL Cocaine Metabolite, urine          Cutoff 300 ng/mL Opiate + metabolites, urine        Cutoff 300 ng/mL Phencyclidine (PCP), urine         Cutoff 25 ng/mL Cannabinoid, urine                 Cutoff 50 ng/mL Barbiturates + metabolites, urine  Cutoff 200 ng/mL Benzodiazepine, urine              Cutoff 200 ng/mL Methadone, urine                   Cutoff 300 ng/mL The urine drug screen provides only a preliminary, unconfirmed analytical test result and should not be used for non-medical purposes. Clinical consideration and professional judgment should be  applied to any positive drug screen result due to possible interfering substances. A more specific alternate chemical method must be used in order to obtain a confirmed analytical result. Gas chromatography / mass spectrometry (GC/MS) is the preferred confirmat ory method. Performed at [facility], [address] [place], [place] [address]     No current facility-administered medications for this encounter.   Current Outpatient Medications  Medication Sig Dispense Refill  . Multiple Vitamin (MULTIVITAMIN WITH MINERALS) TABS tablet Take 1 tablet by mouth daily.    Marland Kitchen ibuprofen (ADVIL) 800 MG tablet Take 1 tablet (800 mg total) by mouth every 8 (eight) hours as needed. (Patient not taking: Reported on 10/21/2019) 30 tablet 0    Musculoskeletal: Strength & Muscle Tone: within normal limits Gait & Station: normal Patient leans: N/A  Psychiatric Specialty Exam: Physical Exam  Nursing note and vitals reviewed. Constitutional: He is oriented to person, place, and time. He appears well-developed and well-nourished.   Cardiovascular: Normal rate.  Respiratory: Effort normal.  Musculoskeletal:        General: Normal range of motion.  Neurological: He is alert and oriented to person, place, and time.  Skin: Skin is warm.    Review of Systems  Constitutional: Negative.   HENT: Negative.   Eyes: Negative.   Respiratory: Negative.   Cardiovascular: Negative.   Gastrointestinal: Negative.   Genitourinary: Negative.   Musculoskeletal: Negative.   Skin: Negative.   Neurological: Negative.   Psychiatric/Behavioral: Negative.     Blood pressure (!) 153/85, pulse 84, temperature 98.3 F (36.8 C), resp. rate 18, height 5\' 9"  (1.753 m), weight 79.4 kg, SpO2 98 %.Body mass index is 25.84 kg/m.  General Appearance: Casual  Eye Contact:  Good  Speech:  Clear and Coherent and Normal Rate  Volume:  Normal  Mood:  Anxious  Affect:  Congruent  Thought Process:  Coherent and Descriptions of Associations: Intact  Orientation:  Full (Time, Place, and Person)  Thought Content:  WDL  Suicidal Thoughts:  No  Homicidal Thoughts:  No  Memory:  Immediate;   Good Recent;   Good Remote;   Good  Judgement:  Fair  Insight:  Fair  Psychomotor Activity:  Normal  Concentration:  Concentration: Good  Recall:  Good  Fund of Knowledge:  Fair  Language:  Good  Akathisia:  No  Handed:  Right  AIMS (if indicated):     Assets:  Communication Skills Desire for Improvement Financial Resources/Insurance Housing Physical Health Social Support Transportation  ADL's:  Intact  Cognition:  WNL  Sleep:        Treatment Plan Summary: Follow-up with outpatient counselor Father to remove all firearms and weapons from the house prior to the patient arriving at the house  Disposition: No evidence of imminent risk to self or others at present.   Patient does not meet criteria for psychiatric inpatient admission. Discussed crisis plan, support from social network, calling 911, coming to the Emergency Department, and  calling Suicide Hotline.  Money, FNP 10/21/2019 10:14 AM

## 2019-10-21 NOTE — Discharge Instructions (Signed)

## 2019-10-21 NOTE — ED Notes (Signed)
Pt discharged home with his father.  VS stable. Pt denies SI.  All belongings returned to pt.  Discharge instructions reviewed with pt. Pt signed for discharge.

## 2019-10-21 NOTE — ED Provider Notes (Signed)
Clinical Course as of Oct 20 1013  Sun Oct 21, 2019  0951 Case discussed with T Money (NP). IVC rescinded. Guns removed from home. He is nolonger having SI/HI.    [MQ]    Clinical Course User Index [MQ] Sharyn Creamer, MD   Ronald Vega has staffed the case and discussed this with Dr. Jola Babinski; both recommending discharge  I personally discussed the case with the patient as well, he denies any suicidal ideation.  He reports his intent was never to shoot himself but instead to get a reaction from his wife.  He currently denies any suicidal ideation and reports he never intended to shoot himself.  His father has gone and taken his guns out of his home, he understands that he should he start feeling this again he should call 911 immediately.     Sharyn Creamer, MD 10/21/19 1019

## 2019-10-21 NOTE — ED Notes (Signed)
EDP talking to patient at this time.

## 2019-10-21 NOTE — BH Assessment (Signed)
Assessment Note  Ronald Vega is an 34 y.o. male presenting to [facility] ED under IVC. Per triage note Pt to the ER for IVC r/t a suicidal threat. Pt wife called 911 and stated that pt tonight held a gun to his head and stated "I might as well blow my brains out." Pt is calm and cooperative. Pt denies any SI. Pt states he was arguing with wife and made the statement. Pt denies any intent on committing suicide. Pt is appropriate. Per patient's nurse Pt. Here today under IVC.  Pt. States that he got into argument with wife this evening and he said something he regretted to get wife's attention.  Pt. Denies SI/HI and AV/H.  Pt. Denies any psych. Hx or psych medication.  Pt. Calm and cooperative. During assessment at first patient was calm and pleasant but became agitated when asked to give detail about his encounter tonight "I don't remember it's 4am." Patient did admit that he had a loaded gun and made the gesture to want to hurt himself. Patient was very vague with details regarding what lead up the situation "I don't know she got mad and left and I was trying to find her, we got into a heated argument." Patient denies current SI/HI/AH/VH. Patient BAL is 71, no other substances detected.   Per Psyc NP patient will be observed overnight and reassessed in the morning.  Diagnosis: Suicidal behavior  Past Medical History:  Past Medical History:  Diagnosis Date  . Status post laparoscopic hernia repair 07/10/2019    Past Surgical History:  Procedure Laterality Date  . KNEE SURGERY Right 2008   arthroscopy and repair of ham string, acl   screws  . left leg surgery  2005,2006   rod placed and then removed  . UMBILICAL HERNIA REPAIR N/A 07/10/2019   Procedure: HERNIA REPAIR UMBILICAL ADULT;  Surgeon: Campbell Lerner, MD;  Location: [facility] ORS;  Service: General;  Laterality: N/A;  . VASECTOMY N/A 07/10/2019   Procedure: VASECTOMY;  Surgeon: Riki Altes, MD;  Location: [facility] ORS;  Service: Urology;   Laterality: N/A;  . XI ROBOTIC ASSISTED INGUINAL HERNIA REPAIR WITH MESH Left 07/10/2019   Procedure: XI ROBOTIC ASSISTED INGUINAL HERNIA REPAIR WITH MESH;  Surgeon: Campbell Lerner, MD;  Location: [facility] ORS;  Service: General;  Laterality: Left;    Family History: No family history on file.  Social History:  reports that he has never smoked. He has never used smokeless tobacco. He reports current alcohol use. He reports that he does not use drugs.  Additional Social History:  Alcohol / Drug Use Pain Medications: See MAR Prescriptions: See MAR Over the Counter: See MAR History of alcohol / drug use?: Yes Substance #1 Name of Substance 1: Alcohol  CIWA: CIWA-Ar BP: (!) 153/85 Pulse Rate: 84 COWS:    Allergies: No Known Allergies  Home Medications: (Not in a hospital admission)   OB/GYN Status:  No LMP for male patient.  General Assessment Data Location of Assessment: [facility] ED TTS Assessment: In system Is this a Tele or Face-to-Face Assessment?: Face-to-Face Is this an Initial Assessment or a Re-assessment for this encounter?: Initial Assessment Patient Accompanied by:: N/A Language Other than English: No Living Arrangements: Other (Comment)(Private Residence) What gender do you identify as?: Male Marital status: Married Living Arrangements: Spouse/significant other, Children Can pt return to current living arrangement?: Yes Admission Status: Involuntary Petitioner: Police Is patient capable of signing voluntary admission?: No Referral Source: Other Insurance type: None  Medical Screening Exam (  [place]) Medical Exam completed: Yes  Crisis Care Plan Living Arrangements: Spouse/significant other, Children Legal Guardian: Other:(Self) Name of Psychiatrist: None Name of Therapist: None  Education Status Is patient currently in school?: No Is the patient employed, unemployed or receiving disability?: Unemployed  Risk to self with the past 6 months Suicidal  Ideation: No-Not Currently/Within Last 6 Months Has patient been a risk to self within the past 6 months prior to admission? : No Suicidal Intent: No-Not Currently/Within Last 6 Months Has patient had any suicidal intent within the past 6 months prior to admission? : No Is patient at risk for suicide?: No Suicidal Plan?: No-Not Currently/Within Last 6 Months Has patient had any suicidal plan within the past 6 months prior to admission? : Yes Access to Means: Yes Specify Access to Suicidal Means: Patient has access to a loaded gun What has been your use of drugs/alcohol within the last 12 months?: Alcohol use Previous Attempts/Gestures: No How many times?: 0 Triggers for Past Attempts: None known Intentional Self Injurious Behavior: None Family Suicide History: Unknown Recent stressful life event(s): Other (Comment)(Relationship issues with wife) Persecutory voices/beliefs?: No Depression: No Substance abuse history and/or treatment for substance abuse?: No Suicide prevention information given to non-admitted patients: Not applicable  Risk to Others within the past 6 months Homicidal Ideation: No Does patient have any lifetime risk of violence toward others beyond the six months prior to admission? : No Thoughts of Harm to Others: No Current Homicidal Intent: No Current Homicidal Plan: No Access to Homicidal Means: No History of harm to others?: No Assessment of Violence: None Noted Does patient have access to weapons?: No Criminal Charges Pending?: No Does patient have a court date: No Is patient on probation?: No  Psychosis Hallucinations: None noted Delusions: None noted  Mental Status Report Appearance/Hygiene: In scrubs Eye Contact: Good Motor Activity: Freedom of movement Speech: Logical/coherent, Argumentative Level of Consciousness: Alert Mood: Angry Affect: Appropriate to circumstance Anxiety Level: Minimal Thought Processes: Coherent Judgement:  Unimpaired Orientation: Person, Place, Time, Situation, Appropriate for developmental age Obsessive Compulsive Thoughts/Behaviors: None  Cognitive Functioning Concentration: Normal Memory: Recent Intact, Remote Intact Is patient IDD: No Insight: Poor Impulse Control: Poor Appetite: Fair Have you had any weight changes? : No Change Sleep: No Change Total Hours of Sleep: 6 Vegetative Symptoms: None  ADLScreening [facility] Assessment Services) Patient's cognitive ability adequate to safely complete daily activities?: Yes Patient able to express need for assistance with ADLs?: Yes Independently performs ADLs?: Yes (appropriate for developmental age)  Prior Inpatient Therapy Prior Inpatient Therapy: No  Prior Outpatient Therapy Prior Outpatient Therapy: No Does patient have an ACCT team?: No Does patient have Intensive In-House Services?  : No Does patient have [facility] services? : No Does patient have P4CC services?: No  ADL Screening (condition at time of admission) Patient's cognitive ability adequate to safely complete daily activities?: Yes Is the patient deaf or have difficulty hearing?: No Does the patient have difficulty seeing, even when wearing glasses/contacts?: No Does the patient have difficulty concentrating, remembering, or making decisions?: No Patient able to express need for assistance with ADLs?: Yes Does the patient have difficulty dressing or bathing?: No Independently performs ADLs?: Yes (appropriate for developmental age) Does the patient have difficulty walking or climbing stairs?: No Weakness of Legs: None Weakness of Arms/Hands: None  Home Assistive Devices/Equipment Home Assistive Devices/Equipment: None  Therapy Consults (therapy consults require a physician order) PT Evaluation Needed: No OT Evalulation Needed: No SLP Evaluation Needed: No  Abuse/Neglect Assessment (Assessment to be complete while patient is alone) Abuse/Neglect Assessment Can Be  Completed: Yes Physical Abuse: Denies Verbal Abuse: Denies Sexual Abuse: Denies Exploitation of patient/patient's resources: Denies Self-Neglect: Denies Values / Beliefs Cultural Requests During Hospitalization: None Spiritual Requests During Hospitalization: None Consults Spiritual Care Consult Needed: No Transition of Care Team Consult Needed: No Advance Directives (For Healthcare) Does Patient Have a Medical Advance Directive?: No          Disposition: Per Psyc NP patient will be observed overnight and reassessed in the morning. Disposition Initial Assessment Completed for this Encounter: Yes  On Site Evaluation by:   Reviewed with Physician:    Benay Pike MS LCASA 10/21/2019 4:41 AM

## 2019-10-21 NOTE — ED Notes (Signed)
TTS and NP talking to pt. In room.

## 2019-10-21 NOTE — ED Triage Notes (Signed)
Pt to the ER for IVC r/t a suicidal threat. Pt wife called 911 and stated that pt tonight held a gun to his head and stated "I might as well blow my brains out." Pt is calm and cooperative. Pt denies any SI. Pt states he was arguing with wife and made the statement. Pt denies any intent on committing suicide. Pt is appropriate.

## 2019-10-21 NOTE — ED Notes (Signed)
Pt. Here today under IVC.  Pt. States that he got into argument with wife this evening and he said something he regretted to get wife's attention.  Pt. Denies SI/HI and AV/H.  Pt. Denies any psych. Hx or psych medication.  Pt. Calm and cooperative.

## 2019-10-21 NOTE — ED Provider Notes (Signed)
[facility] Emergency Department Provider Note _____________   First MD Initiated Contact with Patient 10/21/19 0310     (approximate)  I have reviewed the triage vital signs and the nursing notes.   HISTORY  Chief Complaint ivc    HPI Ronald Vega is a 34 y.o. male with below list of previous medical conditions presents to the emergency department secondary to involuntary commitment.  Per commitment the patient placed a gun to his head and stated "I might as well blow my brains out".  Patient states that this occurred while having a verbal altercation with his wife.  He does admit to making the statement.  Patient however denies any suicidal ideation at present.  Patient does admit to EtOH ingestion earlier in the evening.        Past Medical History:  Diagnosis Date  . Status post laparoscopic hernia repair 07/10/2019    Patient Active Problem List   Diagnosis Date Noted  . Suicidal behavior 10/21/2019  . Status post laparoscopic hernia repair 07/10/2019    Past Surgical History:  Procedure Laterality Date  . KNEE SURGERY Right 2008   arthroscopy and repair of ham string, acl   screws  . left leg surgery  2005,2006   rod placed and then removed  . UMBILICAL HERNIA REPAIR N/A 07/10/2019   Procedure: HERNIA REPAIR UMBILICAL ADULT;  Surgeon: [name], MD;  Location: [facility] ORS;  Service: General;  Laterality: N/A;  . VASECTOMY N/A 07/10/2019   Procedure: VASECTOMY;  Surgeon: Abbie Sons, MD;  Location: [facility] ORS;  Service: Urology;  Laterality: N/A;  . XI ROBOTIC ASSISTED INGUINAL HERNIA REPAIR WITH MESH Left 07/10/2019   Procedure: XI ROBOTIC ASSISTED INGUINAL HERNIA REPAIR WITH MESH;  Surgeon: [name], MD;  Location: [facility] ORS;  Service: General;  Laterality: Left;    Prior to Admission medications   Medication Sig Start Date End Date Taking? Authorizing Provider  ibuprofen (ADVIL) 800 MG tablet Take 1 tablet (800 mg  total) by mouth every 8 (eight) hours as needed. 07/10/19   [name], MD  Multiple Vitamin (MULTIVITAMIN WITH MINERALS) TABS tablet Take 1 tablet by mouth daily.    [provider]    Allergies Patient has no known allergies.  No family history on file.  Social History Social History   Tobacco Use  . Smoking status: Never Smoker  . Smokeless tobacco: Never Used  Substance Use Topics  . Alcohol use: Yes    Comment: rare  . Drug use: No    Review of Systems Constitutional: No fever/chills Eyes: No visual changes. ENT: No sore throat. Cardiovascular: Denies chest pain. Respiratory: Denies shortness of breath. Gastrointestinal: No abdominal pain.  No nausea, no vomiting.  No diarrhea.  No constipation. Genitourinary: Negative for dysuria. Musculoskeletal: Negative for neck pain.  Negative for back pain. Integumentary: Negative for rash. Neurological: Negative for headaches, focal weakness or numbness. Psychiatric:  Positive for suicidal ideation _________________   PHYSICAL EXAM:  VITAL SIGNS: ED Triage Vitals  Enc Vitals Group     BP 10/21/19 0214 (!) 153/85     Pulse Rate 10/21/19 0214 84     Resp 10/21/19 0214 18     Temp 10/21/19 0214 98.3 F (36.8 C)     Temp src --      SpO2 10/21/19 0214 98 %     Weight 10/21/19 0215 79.4 kg (175 lb)     Height 10/21/19 0215 1.753 m (5\' 9" )  Head Circumference --      Peak Flow --      Pain Score 10/21/19 0214 0     Pain Loc --      Pain Edu? --      Excl. in GC? --     Constitutional: Alert and oriented.  Eyes: Conjunctivae are normal.  Mouth/Throat: Patient is wearing a mask. Neck: No stridor.  No meningeal signs.   Cardiovascular: Normal rate, regular rhythm. Good peripheral circulation. Grossly normal heart sounds. Respiratory: Normal respiratory effort.  No retractions. Gastrointestinal: Soft and nontender. No distention.  Musculoskeletal: No lower extremity tenderness nor edema. No gross  deformities of extremities. Neurologic:  Normal speech and language. No gross focal neurologic deficits are appreciated.  Skin:  Skin is warm, dry and intact. Psychiatric: Mood and affect are normal. Speech and behavior are normal.  ____________________________________________   LABS (all labs ordered are listed, but only abnormal results are displayed)  Labs Reviewed  COMPREHENSIVE METABOLIC PANEL - Abnormal; Notable for the following components:      Result Value   BUN 23 (*)    AST 59 (*)    ALT 48 (*)    All other components within normal limits  ETHANOL - Abnormal; Notable for the following components:   Alcohol, Ethyl (B) 71 (*)    All other components within normal limits  SALICYLATE LEVEL - Abnormal; Notable for the following components:   Salicylate Lvl <7.0 (*)    All other components within normal limits  ACETAMINOPHEN LEVEL - Abnormal; Notable for the following components:   Acetaminophen (Tylenol), Serum <10 (*)    All other components within normal limits  CBC  URINE DRUG SCREEN, QUALITATIVE ([facility] ONLY)     Procedures   ____________________________________________   INITIAL IMPRESSION / MDM / ASSESSMENT AND PLAN / ED COURSE  As part of my medical decision making, I reviewed the following data within the electronic MEDICAL RECORD NUMBER\   34 year old male presented with above-stated history and physical exam involuntary committed secondary to suicidal ideation.  Awaiting psychiatry consultation and disposition.     ____________________________________________  FINAL CLINICAL IMPRESSION(S) / ED DIAGNOSES  Final diagnoses:  Suicidal ideation     MEDICATIONS GIVEN DURING THIS VISIT:  Medications - No data to display   ED Discharge Orders    None      *Please note:  Ronald Vega was evaluated in Emergency Department on 10/21/2019 for the symptoms described in the history of present illness. He was evaluated in the context of the global  COVID-19 pandemic, which necessitated consideration that the patient might be at risk for infection with the SARS-CoV-2 virus that causes COVID-19. Institutional protocols and algorithms that pertain to the evaluation of patients at risk for COVID-19 are in a state of rapid change based on information released by regulatory bodies including the CDC and federal and state organizations. These policies and algorithms were followed during the patient's care in the ED.  Some ED evaluations and interventions may be delayed as a result of limited staffing during the pandemic.*  Note:  This document was prepared using Dragon voice recognition software and may include unintentional dictation errors.   Darci Current, MD 10/21/19 [phone]

## 2019-10-21 NOTE — ED Notes (Signed)
Pt belongings entered: shirt, shoes, pants, underwear, smart watch, cell phone, silicone ring. Father's number is [phone].

## 2020-06-05 ENCOUNTER — Encounter: Payer: Self-pay | Admitting: Physician Assistant

## 2020-06-05 ENCOUNTER — Ambulatory Visit: Payer: Self-pay | Admitting: Physician Assistant

## 2020-06-05 ENCOUNTER — Other Ambulatory Visit: Payer: Self-pay

## 2020-06-05 DIAGNOSIS — Z202 Contact with and (suspected) exposure to infections with a predominantly sexual mode of transmission: Secondary | ICD-10-CM

## 2020-06-05 DIAGNOSIS — Z113 Encounter for screening for infections with a predominantly sexual mode of transmission: Secondary | ICD-10-CM

## 2020-06-05 MED ORDER — DOXYCYCLINE HYCLATE 100 MG PO TABS: 100.0000 mg | ORAL_TABLET | Freq: Two times a day (BID) | ORAL | 0 refills | Status: AC

## 2020-06-05 NOTE — Progress Notes (Signed)
Patient here for STD testing. States he was treated as contact to Chlamydia. Patient states his wife continued having symptoms and was retreated, he is here for testing and retreatment.Burt Knack, RN

## 2020-06-05 NOTE — Progress Notes (Signed)
Patient treated as contact to Chlamydia per SO..Zaedyn Covin Brewer-Jensen, RN 

## 2020-06-05 NOTE — Progress Notes (Signed)
   [name] Department STI clinic/screening visit  Subjective:  Ronald Vega is a 34 y.o. male being seen today for an STI screening visit. The patient reports they do not have symptoms.    Patient has the following medical conditions:   Patient Active Problem List   Diagnosis Date Noted  . Suicidal behavior 10/21/2019  . 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 Status post laparoscopic hernia repair 07/10/2019     Chief Complaint  Patient presents with  . Exposure to STD    34 yo man here for STI eval. States his estranged wife was dx with chlamydia several months ago, and she provided him expedited therapy from her provider which he took (azithromycin). They did not have sex again until at least 7 days after her treatment. Last sex together was 05/11/20. She recently developed symptoms and was re-tested and diagnosed with chlamydia again and was re-treated. She told the patient he needs to be treated again. Pt declines physical exam today.   Patient reports he feels well, denies any STI sx. He desires treatment as contact to chlamydia and bloodwork for HIV/syphilis, declines physical exam.    See flowsheet for further details and programmatic requirements.    The following portions of the patient's history were reviewed and updated as appropriate: allergies, current medications, past medical history, past social history, past surgical history and problem list.  Objective:  There were no vitals filed for this visit.  Physical Exam Pt declines physical exam.   Assessment and Plan:  Ronald Vega is a 34 y.o. male presenting to the [facility] Department for STI screening  1. Routine screening for STI (sexually transmitted infection) Treat as contact to chlamydia with doxycycline per s.o.  HIV and syphilis testing today. Encouraged COVID vaccination (pt declines today, but is considering).     Return in about 6 months  (around 12/03/2020) for STI screening.  No future appointments.  Ronald Dyke, PA-C

## 2021-05-27 ENCOUNTER — Other Ambulatory Visit: Payer: Self-pay | Admitting: Physician Assistant

## 2021-05-27 MED ORDER — AZITHROMYCIN 250 MG PO TABS: ORAL_TABLET | ORAL | 0 refills | Status: DC

## 2021-05-27 MED ORDER — BENZONATATE 100 MG PO CAPS: 100.0000 mg | ORAL_CAPSULE | Freq: Three times a day (TID) | ORAL | 0 refills | Status: AC | PRN

## 2021-06-04 ENCOUNTER — Encounter: Payer: Self-pay | Admitting: Emergency Medicine

## 2021-06-04 ENCOUNTER — Other Ambulatory Visit: Payer: Self-pay

## 2021-06-04 ENCOUNTER — Ambulatory Visit
Admission: EM | Admit: 2021-06-04 | Discharge: 2021-06-04 | Disposition: A | Discharge status: Home / Self Care | Payer: MEDICAID | Attending: Emergency Medicine | Admitting: Emergency Medicine

## 2021-06-04 DIAGNOSIS — L237 Allergic contact dermatitis due to plants, except food: Secondary | ICD-10-CM

## 2021-06-04 MED ORDER — METHYLPREDNISOLONE SODIUM SUCC 40 MG IJ SOLR
60.0000 mg | Freq: Once | INTRAMUSCULAR | Status: AC
  Administered 2021-06-04: 60 mg via INTRAMUSCULAR

## 2021-06-04 MED ORDER — PREDNISONE 10 MG (21) PO TBPK: ORAL_TABLET | Freq: Every day | ORAL | 0 refills | Status: DC

## 2021-06-04 NOTE — Discharge Instructions (Signed)
Take prednisone very morning with food as directed on packet   May use antihistamine such as Claritin or Zyrtec daily for itching, for additional comfort may use benadryl   Use calamine lotion for itching   May return to urgent for worsening or persistent rash

## 2021-06-04 NOTE — ED Provider Notes (Signed)
[facility]    CSN: [id number] Arrival date & time: 06/04/21  9509      History   Chief Complaint Chief Complaint  Patient presents with   Rash    HPI Ronald Vega is a 35 y.o. male.   Patient presents with erythematous pruritic rash beginning yesterday morning.  Initially started on his flexor of right arm and has spread to left arm and face.  Patient endorses that he has not been scratching.  Possible exposure to poison ivy.  Has not attempted treatment.  Denies fever, chills, difficulty swallowing, visual changes.   Past Medical History:  Diagnosis Date   Status post laparoscopic hernia repair 07/10/2019    Patient Active Problem List   Diagnosis Date Noted   Suicidal behavior 10/21/2019   Alcohol-induced mood disorder (HCC) 10/21/2019   Status post laparoscopic hernia repair 07/10/2019    Past Surgical History:  Procedure Laterality Date   KNEE SURGERY Right 2008   arthroscopy and repair of ham string, acl   screws   left leg surgery  2005,2006   rod placed and then removed   UMBILICAL HERNIA REPAIR N/A 07/10/2019   Procedure: HERNIA REPAIR UMBILICAL ADULT;  Surgeon: Campbell Lerner, MD;  Location: [facility];  Service: General;  Laterality: N/A;   VASECTOMY N/A 07/10/2019   Procedure: VASECTOMY;  Surgeon: Riki Altes, MD;  Location: [facility];  Service: Urology;  Laterality: N/A;   XI ROBOTIC ASSISTED INGUINAL HERNIA REPAIR WITH MESH Left 07/10/2019   Procedure: XI ROBOTIC ASSISTED INGUINAL HERNIA REPAIR WITH MESH;  Surgeon: Campbell Lerner, MD;  Location: [facility];  Service: General;  Laterality: Left;       Home Medications    Prior to Admission medications   Medication Sig Start Date End Date Taking? Authorizing Provider  predniSONE (STERAPRED UNI-PAK 21 TAB) 10 MG (21) TBPK tablet Take by mouth daily. Take 6 tabs by mouth daily  for 2 days, then 5 tabs for 2 days, then 4 tabs for 2 days, then 3 tabs for 2 days, 2 tabs for 2 days, then 1  tab by mouth daily for 2 days 06/04/21  Yes Kailey Esquilin R, NP  benzonatate (TESSALON PERLES) 100 MG capsule Take 1 capsule (100 mg total) by mouth 3 (three) times daily as needed for cough. 05/27/21 05/27/22  Donovan Kail, PA-C  ibuprofen (ADVIL) 800 MG tablet Take 1 tablet (800 mg total) by mouth every 8 (eight) hours as needed. Patient not taking: Reported on 10/21/2019 07/10/19   Campbell Lerner, MD  Multiple Vitamin (MULTIVITAMIN WITH MINERALS) TABS tablet Take 1 tablet by mouth daily.    [provider]    Family History No family history on file.  Social History Social History   Tobacco Use   Smoking status: Some Days   Smokeless tobacco: Never   Tobacco comments:    occasional smoker  Vaping Use   Vaping Use: Never used  Substance Use Topics   Alcohol use: Yes    Comment: rare   Drug use: No     Allergies   Patient has no known allergies.   Review of Systems Review of Systems  Constitutional: Negative.   Respiratory: Negative.    Cardiovascular: Negative.   Gastrointestinal: Negative.   Musculoskeletal: Negative.   Skin:  Positive for rash. Negative for color change, pallor and wound.  Neurological: Negative.     Physical Exam Triage Vital Signs ED Triage Vitals  Enc Vitals Group  BP 06/04/21 0902 132/88     Pulse Rate 06/04/21 0902 76     Resp 06/04/21 0902 18     Temp 06/04/21 0902 98.2 F (36.8 C)     Temp Source 06/04/21 0902 Oral     SpO2 06/04/21 0902 98 %     Weight --      Height --      Head Circumference --      Peak Flow --      Pain Score 06/04/21 0901 0     Pain Loc --      Pain Edu? --      Excl. in [place]? --    No data found.  Updated Vital Signs BP 132/88 (BP Location: Right Arm)   Pulse 76   Temp 98.2 F (36.8 C) (Oral)   Resp 18   SpO2 98%   Visual Acuity Right Eye Distance:   Left Eye Distance:   Bilateral Distance:    Right Eye Near:   Left Eye Near:    Bilateral Near:     Physical  Exam Constitutional:      Appearance: Normal appearance. He is normal weight.  HENT:     Head: Normocephalic.  Eyes:     Extraocular Movements: Extraocular movements intact.  Pulmonary:     Effort: Pulmonary effort is normal.  Skin:    Comments: Erythematous maculopapular rash present on the flexor right arm, left upper arm, nose bridge and around mouth  Neurological:     General: No focal deficit present.     Mental Status: He is alert and oriented to person, place, and time. Mental status is at baseline.  Psychiatric:        Mood and Affect: Mood normal.        Behavior: Behavior normal.     UC Treatments / Results  Labs (all labs ordered are listed, but only abnormal results are displayed) Labs Reviewed - No data to display  EKG   Radiology No results found.  Procedures Procedures (including critical care time)  Medications Ordered in UC Medications  methylPREDNISolone sodium succinate (SOLU-MEDROL) 40 mg/mL injection 60 mg (has no administration in time range)    Initial Impression / Assessment and Plan / UC Course  I have reviewed the triage vital signs and the nursing notes.  Pertinent labs & imaging results that were available during my care of the patient were reviewed by me and considered in my medical decision making (see chart for details).  Poison ivy dermatitis  1.  Methylprednisolone 60 mg IM now 2.  Prednisone 60 mg taper 3.  Recommended antihistamines for itching 4.  May follow-up with urgent care as needed Final Clinical Impressions(s) / UC Diagnoses   Final diagnoses:  Poison ivy dermatitis     Discharge Instructions      Take prednisone very morning with food as directed on packet   May use antihistamine such as Claritin or Zyrtec daily for itching, for additional comfort may use benadryl   Use calamine lotion for itching   May return to urgent for worsening or persistent rash      ED Prescriptions     Medication Sig Dispense  Auth. Provider   predniSONE (STERAPRED UNI-PAK 21 TAB) 10 MG (21) TBPK tablet Take by mouth daily. Take 6 tabs by mouth daily  for 2 days, then 5 tabs for 2 days, then 4 tabs for 2 days, then 3 tabs for 2 days, 2 tabs for 2 days,  then 1 tab by mouth daily for 2 days 42 tablet Levie Wages, Leitha Schuller, NP      PDMP not reviewed this encounter.   Hans [place], [place] 06/04/21 [phone]

## 2021-06-04 NOTE — ED Triage Notes (Signed)
Pt presents today with c/o of rash to face and bilateral forearms that he believes is poison ivy.

## 2021-06-06 ENCOUNTER — Other Ambulatory Visit: Payer: Self-pay

## 2021-06-06 ENCOUNTER — Ambulatory Visit
Admission: EM | Admit: 2021-06-06 | Discharge: 2021-06-06 | Disposition: A | Discharge status: Home / Self Care | Payer: MEDICAID

## 2021-06-06 ENCOUNTER — Encounter: Payer: Self-pay | Admitting: Emergency Medicine

## 2021-06-06 DIAGNOSIS — L247 Irritant contact dermatitis due to plants, except food: Secondary | ICD-10-CM

## 2021-06-06 NOTE — ED Provider Notes (Signed)
[facility]    CSN: [id number] Arrival date & time: 06/06/21  7322      History   Chief Complaint Chief Complaint  Patient presents with   Rash    HPI Ronald Vega is a 35 y.o. male.   HPI  35 year old male here for evaluation of worsening poison ivy rash.Patient was evaluated in this emergency department  Patient was evaluated in this urgent care 2 days ago for exposure to poison ivy the day prior.  He had lesions on the flexor surface of his right arm, left arm, and some on the bridge of his nose.  He was given injection of Solu-Medrol and discharged home on prednisone.  He has taken a second dose of prednisone this morning but is concerned because the rash has spread to the tissue surrounding his left eye, on both ears, his lips are now swollen, the rashes on his neck, and abdomen.  He states he wakes up and there is drainage on his pillowcase.  He has not taken any antihistamines and he has not applied any calamine lotion to the rash to help dry it up.  He denies any tightness in his throat, difficulty swallowing, or changes to his vision.  He did report having some discharge on his lashes this morning and he included them with a mixture of warm and cold compresses.  Past Medical History:  Diagnosis Date   Status post laparoscopic hernia repair 07/10/2019    Patient Active Problem List   Diagnosis Date Noted   Suicidal behavior 10/21/2019   Alcohol-induced mood disorder (HCC) 10/21/2019   Status post laparoscopic hernia repair 07/10/2019    Past Surgical History:  Procedure Laterality Date   KNEE SURGERY Right 2008   arthroscopy and repair of ham string, acl   screws   left leg surgery  2005,2006   rod placed and then removed   UMBILICAL HERNIA REPAIR N/A 07/10/2019   Procedure: HERNIA REPAIR UMBILICAL ADULT;  Surgeon: Campbell Lerner, MD;  Location: [facility];  Service: General;  Laterality: N/A;   VASECTOMY N/A 07/10/2019   Procedure: VASECTOMY;   Surgeon: Riki Altes, MD;  Location: [facility];  Service: Urology;  Laterality: N/A;   XI ROBOTIC ASSISTED INGUINAL HERNIA REPAIR WITH MESH Left 07/10/2019   Procedure: XI ROBOTIC ASSISTED INGUINAL HERNIA REPAIR WITH MESH;  Surgeon: Campbell Lerner, MD;  Location: [facility];  Service: General;  Laterality: Left;       Home Medications    Prior to Admission medications   Medication Sig Start Date End Date Taking? Authorizing Provider  Multiple Vitamin (MULTIVITAMIN WITH MINERALS) TABS tablet Take 1 tablet by mouth daily.   Yes [provider]  predniSONE (STERAPRED UNI-PAK 21 TAB) 10 MG (21) TBPK tablet Take by mouth daily. Take 6 tabs by mouth daily  for 2 days, then 5 tabs for 2 days, then 4 tabs for 2 days, then 3 tabs for 2 days, 2 tabs for 2 days, then 1 tab by mouth daily for 2 days 06/04/21  Yes White, Adrienne R, NP  benzonatate (TESSALON PERLES) 100 MG capsule Take 1 capsule (100 mg total) by mouth 3 (three) times daily as needed for cough. 05/27/21 05/27/22  Donovan Kail, PA-C  ibuprofen (ADVIL) 800 MG tablet Take 1 tablet (800 mg total) by mouth every 8 (eight) hours as needed. Patient not taking: Reported on 10/21/2019 07/10/19   Campbell Lerner, MD    Family History History reviewed. No pertinent family history.  Social History Social History   Tobacco Use   Smoking status: Some Days   Smokeless tobacco: Never   Tobacco comments:    occasional smoker  Vaping Use   Vaping Use: Never used  Substance Use Topics   Alcohol use: Yes    Comment: rare   Drug use: No     Allergies   Patient has no known allergies.   Review of Systems Review of Systems  Constitutional:  Negative for activity change, appetite change and fever.  HENT:  Positive for facial swelling.   Eyes:  Positive for discharge. Negative for photophobia, pain, redness, itching and visual disturbance.  Skin:  Positive for color change and rash.  Hematological: Negative.    Psychiatric/Behavioral: Negative.      Physical Exam Triage Vital Signs ED Triage Vitals  Enc Vitals Group     BP 06/06/21 0920 (!) 125/99     Pulse Rate 06/06/21 0920 67     Resp 06/06/21 0920 16     Temp 06/06/21 0920 98.4 F (36.9 C)     Temp Source 06/06/21 0920 Oral     SpO2 06/06/21 0920 100 %     Weight 06/06/21 0917 175 lb 0.7 oz (79.4 kg)     Height 06/06/21 0917 5\' 9"  (1.753 m)     Head Circumference --      Peak Flow --      Pain Score 06/06/21 0917 2     Pain Loc --      Pain Edu? --      Excl. in [place]? --    No data found.  Updated Vital Signs BP (!) 125/99 (BP Location: Right Arm)   Pulse 67   Temp 98.4 F (36.9 C) (Oral)   Resp 16   Ht 5\' 9"  (1.753 m)   Wt 175 lb 0.7 oz (79.4 kg)   SpO2 100%   BMI 25.85 kg/m   Visual Acuity Right Eye Distance:   Left Eye Distance:   Bilateral Distance:    Right Eye Near:   Left Eye Near:    Bilateral Near:     Physical Exam Vitals and nursing note reviewed.  Constitutional:      General: He is not in acute distress.    Appearance: Normal appearance. He is not ill-appearing.  HENT:     Head: Normocephalic and atraumatic.  Cardiovascular:     Rate and Rhythm: Normal rate and regular rhythm.     Pulses: Normal pulses.     Heart sounds: Normal heart sounds. No murmur heard.   No gallop.  Pulmonary:     Effort: Pulmonary effort is normal.     Breath sounds: Normal breath sounds. No stridor. No wheezing, rhonchi or rales.  Skin:    General: Skin is warm and dry.     Capillary Refill: Capillary refill takes less than 2 seconds.     Findings: Erythema and rash present.  Neurological:     General: No focal deficit present.     Mental Status: He is alert and oriented to person, place, and time.  Psychiatric:        Mood and Affect: Mood normal.        Behavior: Behavior normal.        Thought Content: Thought content normal.        Judgment: Judgment normal.     UC Treatments / Results  Labs (all labs  ordered are listed, but only abnormal results are displayed) Labs  Reviewed - No data to display  EKG   Radiology No results found.  Procedures Procedures (including critical care time)  Medications Ordered in UC Medications - No data to display  Initial Impression / Assessment and Plan / UC Course  I have reviewed the triage vital signs and the nursing notes.  Pertinent labs & imaging results that were available during my care of the patient were reviewed by me and considered in my medical decision making (see chart for details).  Patient is a nontoxic-appearing 35 year old male here for evaluation of worsening rash from poison ivy exposure 3 days ago as described in the HPI above.  Patient has erythema and edema to the bridge of the nose, his lower lip, left Perry orbital region, both ears, anterior neck, chest, and abdomen.  He also has lesions on the flexor surfaces of both forearms.  There is some yellow discharge and weeping from the lesions on the ears but the rest of the lesions appear to be dry.  No vesicles noted.  Patient denies any tightness in his throat and there is no stridor when auscultating over the trachea.  Oropharyngeal exam reveals pink and moist oropharyngeal tissue with a Mallampati score of 2.  No injection or edema noted.  The left eye has no injection to bulbar bilateral conjunctiva.  The upper and lower eyelid and periorbital region are mildly edematous and erythematous.  There is no vesicular or crusted lesions noted.  Cardiopulmonary [place] lung sounds in all fields.  Advised patient that he needs to continue his prednisone taper, apply calamine lotion to all the lesions except for his lip and I talked dry them up.  He needs to wear gloves at bed at night to keep from scratching and take Allegra 180 mg during the day and Benadryl 50 mg at night to help with itching.  Also advised him that he needs to take the Pepcid 20 mg twice daily as he was directed to do 2 days  ago when he was seen in this clinic.  I also vies the patient that if he develops any changes to his vision, worsening swelling of his lips, tongue, or difficulty swallowing or tightness in the throat he needs to go to the ER for evaluation.   Final Clinical Impressions(s) / UC Diagnoses   Final diagnoses:  Irritant contact dermatitis due to plants, except food     Discharge Instructions      Take over-the-counter Allegra 180 mg daily to help with your itching.  You can take over-the-counter Benadryl, 50 mg at bedtime, as needed for itching and sleep.  Take the prednisone pack according to the package instructions.  You will taken on tapering dose over a period of 6 days.  Take it with food and always take it first in the morning with breakfast.  Take over-the-counter Pepcid 20 mg twice daily to help with itching as well.  Apply Calamine lotion to the rash to dry it up. You can use it everywhere but around your eye and lips.   If you develop any swelling of your lips or tongue, tightness in your throat, or difficulty breathing you need to go to the ER for evaluation. Also if you develop changes in vision.     ED Prescriptions   None    PDMP not reviewed this encounter.   Margarette Canada, NP 06/06/21 [phone]

## 2021-06-06 NOTE — ED Triage Notes (Signed)
Patient was seen on Thursday for body rash that started on Tuesday morning.  Patient was seen here on Thursday.  Patient was given a steroid shot and currently on Prednisone. Patient states that his rash has spread to his face and lips.

## 2021-06-06 NOTE — Discharge Instructions (Signed)
Take over-the-counter Allegra 180 mg daily to help with your itching.  You can take over-the-counter Benadryl, 50 mg at bedtime, as needed for itching and sleep.  Take the prednisone pack according to the package instructions.  You will taken on tapering dose over a period of 6 days.  Take it with food and always take it first in the morning with breakfast.  Take over-the-counter Pepcid 20 mg twice daily to help with itching as well.  Apply Calamine lotion to the rash to dry it up. You can use it everywhere but around your eye and lips.   If you develop any swelling of your lips or tongue, tightness in your throat, or difficulty breathing you need to go to the ER for evaluation. Also if you develop changes in vision.

## 2021-12-06 ENCOUNTER — Other Ambulatory Visit: Payer: Self-pay | Admitting: Physician Assistant

## 2021-12-06 MED ORDER — AZITHROMYCIN 250 MG PO TABS: ORAL_TABLET | ORAL | 0 refills | Status: AC

## 2022-02-23 ENCOUNTER — Other Ambulatory Visit: Payer: Self-pay | Admitting: Physician Assistant

## 2022-02-23 MED ORDER — PREDNISONE 10 MG (21) PO TBPK: ORAL_TABLET | Freq: Every day | ORAL | 0 refills | Status: DC

## 2022-06-30 ENCOUNTER — Other Ambulatory Visit: Payer: Self-pay | Admitting: Physician Assistant

## 2022-06-30 MED ORDER — ONDANSETRON 4 MG PO TBDP: 4.0000 mg | ORAL_TABLET | ORAL | 0 refills | Status: DC | PRN

## 2022-08-09 ENCOUNTER — Encounter: Payer: Self-pay | Admitting: Physician Assistant

## 2022-11-24 ENCOUNTER — Other Ambulatory Visit: Payer: Self-pay | Admitting: Physician Assistant

## 2022-11-24 MED ORDER — PREDNISONE 10 MG (21) PO TBPK: ORAL_TABLET | Freq: Every day | ORAL | 0 refills | Status: DC

## 2023-04-23 ENCOUNTER — Encounter: Payer: Self-pay | Admitting: Physician Assistant

## 2023-04-23 MED ORDER — AZITHROMYCIN 250 MG PO TABS: ORAL_TABLET | ORAL | 0 refills | Status: DC

## 2023-04-25 ENCOUNTER — Other Ambulatory Visit: Payer: Self-pay | Admitting: Physician Assistant

## 2023-04-25 MED ORDER — BENZONATATE 100 MG PO CAPS: 100.0000 mg | ORAL_CAPSULE | Freq: Three times a day (TID) | ORAL | 0 refills | Status: AC | PRN

## 2023-10-31 ENCOUNTER — Other Ambulatory Visit: Payer: Self-pay | Admitting: Physician Assistant

## 2023-10-31 MED ORDER — ONDANSETRON 4 MG PO TBDP: 4.0000 mg | ORAL_TABLET | ORAL | 0 refills | Status: AC | PRN

## 2024-07-18 ENCOUNTER — Other Ambulatory Visit: Payer: Self-pay | Admitting: Physician Assistant

## 2024-07-18 MED ORDER — AZITHROMYCIN 250 MG PO TABS: ORAL_TABLET | ORAL | 0 refills | Status: AC
